# Patient Record
Sex: Female | Born: 1945 | Race: Black or African American | Hispanic: No | Marital: Married | Smoking: Former smoker
Health system: Southern US, Community
[De-identification: ages and names within clinical notes are randomized; demographics above are authoritative.]

## PROBLEM LIST (undated history)

## (undated) DIAGNOSIS — I10 Essential (primary) hypertension: Secondary | ICD-10-CM

## (undated) HISTORY — PX: BREAST CYST ASPIRATION: SHX578

## (undated) HISTORY — PX: ABDOMINAL HYSTERECTOMY: SHX81

---

## 1999-12-11 ENCOUNTER — Encounter: Admission: RE | Admit: 1999-12-11 | Discharge: 1999-12-11 | Payer: Self-pay | Admitting: Emergency Medicine

## 1999-12-11 ENCOUNTER — Encounter: Payer: Self-pay | Admitting: Emergency Medicine

## 1999-12-18 ENCOUNTER — Other Ambulatory Visit: Admission: RE | Admit: 1999-12-18 | Discharge: 1999-12-18 | Payer: Self-pay | Admitting: Emergency Medicine

## 1999-12-18 ENCOUNTER — Encounter: Payer: Self-pay | Admitting: Emergency Medicine

## 1999-12-18 ENCOUNTER — Encounter (INDEPENDENT_AMBULATORY_CARE_PROVIDER_SITE_OTHER): Payer: Self-pay | Admitting: Specialist

## 1999-12-18 ENCOUNTER — Encounter: Admission: RE | Admit: 1999-12-18 | Discharge: 1999-12-18 | Payer: Self-pay | Admitting: Emergency Medicine

## 2000-02-18 ENCOUNTER — Encounter: Admission: RE | Admit: 2000-02-18 | Discharge: 2000-02-18 | Payer: Self-pay

## 2000-08-21 ENCOUNTER — Encounter: Admission: RE | Admit: 2000-08-21 | Discharge: 2000-08-21 | Payer: Self-pay | Admitting: Emergency Medicine

## 2000-08-21 ENCOUNTER — Encounter: Payer: Self-pay | Admitting: Emergency Medicine

## 2000-12-24 ENCOUNTER — Encounter: Admission: RE | Admit: 2000-12-24 | Discharge: 2000-12-24 | Payer: Self-pay | Admitting: Emergency Medicine

## 2000-12-24 ENCOUNTER — Encounter: Payer: Self-pay | Admitting: Emergency Medicine

## 2000-12-30 ENCOUNTER — Encounter: Payer: Self-pay | Admitting: Emergency Medicine

## 2000-12-30 ENCOUNTER — Encounter: Admission: RE | Admit: 2000-12-30 | Discharge: 2000-12-30 | Payer: Self-pay | Admitting: Emergency Medicine

## 2001-06-18 ENCOUNTER — Encounter: Admission: RE | Admit: 2001-06-18 | Discharge: 2001-06-18 | Payer: Self-pay | Admitting: Emergency Medicine

## 2001-06-18 ENCOUNTER — Encounter: Payer: Self-pay | Admitting: Emergency Medicine

## 2002-01-14 ENCOUNTER — Encounter: Payer: Self-pay | Admitting: Emergency Medicine

## 2002-01-14 ENCOUNTER — Encounter: Admission: RE | Admit: 2002-01-14 | Discharge: 2002-01-14 | Payer: Self-pay | Admitting: Emergency Medicine

## 2002-06-22 ENCOUNTER — Encounter: Admission: RE | Admit: 2002-06-22 | Discharge: 2002-06-22 | Payer: Self-pay | Admitting: Emergency Medicine

## 2002-06-22 ENCOUNTER — Encounter: Payer: Self-pay | Admitting: Emergency Medicine

## 2002-07-21 ENCOUNTER — Encounter: Payer: Self-pay | Admitting: Urology

## 2002-07-21 ENCOUNTER — Encounter: Admission: RE | Admit: 2002-07-21 | Discharge: 2002-07-21 | Payer: Self-pay | Admitting: Urology

## 2002-08-02 ENCOUNTER — Ambulatory Visit (HOSPITAL_BASED_OUTPATIENT_CLINIC_OR_DEPARTMENT_OTHER): Admission: RE | Admit: 2002-08-02 | Discharge: 2002-08-02 | Payer: Self-pay | Admitting: Urology

## 2002-08-02 ENCOUNTER — Encounter: Payer: Self-pay | Admitting: Urology

## 2002-08-16 ENCOUNTER — Encounter: Payer: Self-pay | Admitting: Urology

## 2002-08-16 ENCOUNTER — Encounter: Admission: RE | Admit: 2002-08-16 | Discharge: 2002-08-16 | Payer: Self-pay | Admitting: Urology

## 2002-08-20 ENCOUNTER — Encounter: Payer: Self-pay | Admitting: Urology

## 2002-08-20 ENCOUNTER — Encounter: Admission: RE | Admit: 2002-08-20 | Discharge: 2002-08-20 | Payer: Self-pay | Admitting: Urology

## 2003-01-10 ENCOUNTER — Encounter: Payer: Self-pay | Admitting: Urology

## 2003-01-10 ENCOUNTER — Encounter: Admission: RE | Admit: 2003-01-10 | Discharge: 2003-01-10 | Payer: Self-pay | Admitting: Urology

## 2003-01-13 ENCOUNTER — Ambulatory Visit (HOSPITAL_BASED_OUTPATIENT_CLINIC_OR_DEPARTMENT_OTHER): Admission: RE | Admit: 2003-01-13 | Discharge: 2003-01-13 | Payer: Self-pay | Admitting: Urology

## 2003-01-13 ENCOUNTER — Encounter: Payer: Self-pay | Admitting: Urology

## 2003-01-19 ENCOUNTER — Encounter: Payer: Self-pay | Admitting: Emergency Medicine

## 2003-01-19 ENCOUNTER — Encounter: Admission: RE | Admit: 2003-01-19 | Discharge: 2003-01-19 | Payer: Self-pay | Admitting: Emergency Medicine

## 2003-03-30 ENCOUNTER — Ambulatory Visit (HOSPITAL_COMMUNITY): Admission: RE | Admit: 2003-03-30 | Discharge: 2003-03-30 | Payer: Self-pay | Admitting: Gastroenterology

## 2004-02-22 ENCOUNTER — Encounter: Admission: RE | Admit: 2004-02-22 | Discharge: 2004-02-22 | Payer: Self-pay | Admitting: Emergency Medicine

## 2005-03-15 ENCOUNTER — Encounter: Admission: RE | Admit: 2005-03-15 | Discharge: 2005-03-15 | Payer: Self-pay | Admitting: Emergency Medicine

## 2005-08-22 ENCOUNTER — Encounter: Admission: RE | Admit: 2005-08-22 | Discharge: 2005-08-22 | Payer: Self-pay | Admitting: Emergency Medicine

## 2005-08-25 ENCOUNTER — Encounter: Admission: RE | Admit: 2005-08-25 | Discharge: 2005-08-25 | Payer: Self-pay | Admitting: Emergency Medicine

## 2006-10-30 ENCOUNTER — Encounter: Admission: RE | Admit: 2006-10-30 | Discharge: 2006-10-30 | Payer: Self-pay | Admitting: Emergency Medicine

## 2007-11-27 ENCOUNTER — Encounter: Admission: RE | Admit: 2007-11-27 | Discharge: 2007-11-27 | Payer: Self-pay | Admitting: Emergency Medicine

## 2008-12-23 ENCOUNTER — Encounter: Admission: RE | Admit: 2008-12-23 | Discharge: 2008-12-23 | Payer: Self-pay | Admitting: Internal Medicine

## 2010-01-16 ENCOUNTER — Encounter: Admission: RE | Admit: 2010-01-16 | Discharge: 2010-01-16 | Payer: Self-pay | Admitting: Internal Medicine

## 2010-12-14 NOTE — Op Note (Signed)
NAME:  Emily Benton, Emily Benton                         ACCOUNT NO.:  [id number]   MEDICAL RECORD NO.:  [id number]                   PATIENT TYPE:  AMB   LOCATION:  ENDO                                 FACILITY:  [facility]   PHYSICIAN:  Anselmo Rod, M.D.               DATE OF BIRTH:  1946/07/03   DATE OF PROCEDURE:  03/30/2003  DATE OF DISCHARGE:                                 OPERATIVE REPORT   PROCEDURE PERFORMED:  Screening colonoscopy.   ENDOSCOPIST:  Charna Elizabeth, M.D.   INSTRUMENT USED:  Olympus video colonoscope.   INDICATIONS FOR PROCEDURE:  The patient is a 65 year old African-American  female undergoing screening colonoscopy to rule out colonic polyps, masses,  hemorrhoids, etc.   PREPROCEDURE PREPARATION:  Informed consent was procured from the patient.  The patient was fasted for eight hours prior to the procedure and prepped  with a bottle of magnesium citrate and a gallon of GoLYTELY the night prior  to the procedure.   PREPROCEDURE PHYSICAL:  The patient had stable vital signs.  Neck supple.  Chest clear to auscultation.  S1 and S2 regular.  Abdomen soft with normal  bowel sounds.   DESCRIPTION OF PROCEDURE:  The patient was placed in left lateral decubitus  position and sedated with 70 mg of Demerol and 7 mg of Versed intravenously.  Once the patient was adequately sedated and maintained on low flow oxygen  and continuous cardiac monitoring, the Olympus video colonoscope was  advanced from the rectum to the cecum and terminal ileum without difficulty.  The entire colonic mucosa appeared healthy with a normal vascular pattern,  except for two diverticula, one in the right colon and one in the left  colon.  The diverticulum in the right colon has inspissated stool in it.  No  other diverticula were identified.  Retroflexion in the rectum revealed no  abnormalities.   IMPRESSION:  Normal colonoscopy up to the terminal ileum except for a couple  of diverticula, one in  the right colon, one in the left colon.   RECOMMENDATIONS:  1. A high fiber diet with liberal fluid intake 20 to 25g in the diet has     been recommended.  2. Repeat colorectal cancer screening is recommended in the next 10 years     unless the patient develops any abnormal symptoms in the interim.  3. Outpatient follow-up on a p.r.n. basis as need arises in the future.                                                   Anselmo Rod, M.D.    JNM/MEDQ  D:  03/30/2003  T:  03/30/2003  Job:  [id number]   cc:   Reuben Likes, M.D.  [address]  [place]  [place] [address]  Fax: [phone]

## 2011-01-07 ENCOUNTER — Other Ambulatory Visit: Payer: Self-pay | Admitting: Internal Medicine

## 2011-01-07 DIAGNOSIS — Z1231 Encounter for screening mammogram for malignant neoplasm of breast: Secondary | ICD-10-CM

## 2011-01-23 ENCOUNTER — Ambulatory Visit
Admission: RE | Admit: 2011-01-23 | Discharge: 2011-01-23 | Disposition: A | Discharge status: Home / Self Care | Payer: 59 | Source: Ambulatory Visit | Attending: Internal Medicine | Admitting: Internal Medicine

## 2011-01-23 DIAGNOSIS — Z1231 Encounter for screening mammogram for malignant neoplasm of breast: Secondary | ICD-10-CM

## 2012-01-06 ENCOUNTER — Other Ambulatory Visit: Payer: Self-pay | Admitting: Internal Medicine

## 2012-01-06 DIAGNOSIS — Z1231 Encounter for screening mammogram for malignant neoplasm of breast: Secondary | ICD-10-CM

## 2012-01-29 ENCOUNTER — Ambulatory Visit
Admission: RE | Admit: 2012-01-29 | Discharge: 2012-01-29 | Disposition: A | Discharge status: Home / Self Care | Payer: PRIVATE HEALTH INSURANCE | Source: Ambulatory Visit | Attending: Internal Medicine | Admitting: Internal Medicine

## 2012-01-29 DIAGNOSIS — Z1231 Encounter for screening mammogram for malignant neoplasm of breast: Secondary | ICD-10-CM

## 2013-01-15 ENCOUNTER — Other Ambulatory Visit: Payer: Self-pay

## 2013-01-15 DIAGNOSIS — Z1231 Encounter for screening mammogram for malignant neoplasm of breast: Secondary | ICD-10-CM

## 2013-02-02 ENCOUNTER — Ambulatory Visit
Admission: RE | Admit: 2013-02-02 | Discharge: 2013-02-02 | Disposition: A | Discharge status: Home / Self Care | Payer: Medicare Other | Source: Ambulatory Visit

## 2013-02-02 DIAGNOSIS — Z1231 Encounter for screening mammogram for malignant neoplasm of breast: Secondary | ICD-10-CM

## 2013-05-15 ENCOUNTER — Emergency Department (HOSPITAL_COMMUNITY)
Admission: EM | Admit: 2013-05-15 | Discharge: 2013-05-15 | Disposition: A | Discharge status: Home / Self Care | Payer: Medicare Other | Attending: Emergency Medicine | Admitting: Emergency Medicine

## 2013-05-15 ENCOUNTER — Encounter (HOSPITAL_COMMUNITY): Payer: Self-pay | Admitting: Emergency Medicine

## 2013-05-15 DIAGNOSIS — Z87891 Personal history of nicotine dependence: Secondary | ICD-10-CM | POA: Insufficient documentation

## 2013-05-15 DIAGNOSIS — B029 Zoster without complications: Secondary | ICD-10-CM | POA: Insufficient documentation

## 2013-05-15 MED ORDER — HYDROMORPHONE HCL PF 1 MG/ML IJ SOLN
1.0000 mg | Freq: Once | INTRAMUSCULAR | Status: AC
  Administered 2013-05-15: 1 mg via INTRAMUSCULAR
  Filled 2013-05-15: qty 1

## 2013-05-15 MED ORDER — HYDROCODONE-ACETAMINOPHEN 5-325 MG PO TABS: 1.0000 | ORAL_TABLET | ORAL | Status: DC | PRN

## 2013-05-15 NOTE — ED Notes (Signed)
Pt c/o right sided lower back pain that radiates into groin and right leg; pt sts some numbness in leg; pt sts pain x 4 days; pt sts rash to side of hip that is itching

## 2013-05-15 NOTE — ED Provider Notes (Signed)
CSN: [id number]     Arrival date & time 05/15/13  1427 History   First MD Initiated Contact with Patient 05/15/13 1500     Chief Complaint  Patient presents with  . Back Pain    Patient is a 67 y.o. female presenting with back pain. The history is provided by the patient.  Back Pain Location:  Lumbar spine Radiates to: right leg. Pain severity:  Moderate Onset quality:  Gradual Duration:  4 days Timing:  Constant Progression:  Worsening Chronicity:  New Context comment:  No trauma Worsened by:  Movement Associated symptoms: numbness and tingling   Associated symptoms: no abdominal pain, no fever and no perianal numbness   She also noticed a rash with bumps about 4 days ago.  Initially it was itchy but has become more painful. The pain increases with movement and earlier in the week it was feeling numb.  The pain has also moved to her lower abdomen where the rash is. She was unable to see her doctor so she came to the ED for some pain relief.  Goody powder did not help.  History reviewed. No pertinent past medical history. History reviewed. No pertinent past surgical history. History reviewed. No pertinent family history. History  Substance Use Topics  . Smoking status: Former Games developer  . Smokeless tobacco: Not on file  . Alcohol Use: No   OB History   Grav Para Term Preterm Abortions TAB SAB Ect Mult Living                 Review of Systems  Constitutional: Negative for fever.  Gastrointestinal: Negative for abdominal pain.  Musculoskeletal: Positive for back pain.  Neurological: Positive for tingling and numbness.  All other systems reviewed and are negative.    Allergies  Review of patient's allergies indicates no known allergies.  Home Medications   Current Outpatient Rx  Name  Route  Sig  Dispense  Refill  . HYDROcodone-acetaminophen (NORCO/VICODIN) 5-325 MG per tablet   Oral   Take 1-2 tablets by mouth every 4 (four) hours as needed for pain.   30 tablet  0    BP 164/77  Pulse 97  Temp(Src) 98.6 F (37 C) (Oral)  Resp 20  Ht 4\' 11"  (1.499 m)  Wt 151 lb 7 oz (68.692 kg)  BMI 30.57 kg/m2  SpO2 96% Physical Exam  Nursing note and vitals reviewed. Constitutional: She appears well-developed and well-nourished. No distress.  HENT:  Head: Normocephalic and atraumatic.  Right Ear: External ear normal.  Left Ear: External ear normal.  Eyes: Conjunctivae are normal. Right eye exhibits no discharge. Left eye exhibits no discharge. No scleral icterus.  Neck: Neck supple. No tracheal deviation present.  Cardiovascular: Normal rate, regular rhythm and normal heart sounds.   No murmur heard. Pulmonary/Chest: Effort normal and breath sounds normal. No stridor. No respiratory distress.  Abdominal: Soft. There is no tenderness. There is no rebound.  Musculoskeletal: She exhibits no edema.  Rash as per skin exam  Neurological: She is alert. Cranial nerve deficit: no gross deficits.  Skin: Skin is warm and dry. Rash noted. Rash is papular and vesicular.  Vesicular erythematous rash in lower lumbar spine toward  Psychiatric: She has a normal mood and affect.    ED Course  Procedures (including critical care time) Labs Review Labs Reviewed - No data to display Imaging Review No results found.  EKG Interpretation   None       MDM   1. Shingles  rash    Rash is consistent with shingles.  The source of her back pain and abdominal pain.  Greater than 72 hours from onset.  Antivirals not indicated.  Will dc home on pain meds  Celene Kras, MD 05/15/13 [phone]

## 2014-01-10 ENCOUNTER — Other Ambulatory Visit: Payer: Self-pay

## 2014-01-10 DIAGNOSIS — Z1231 Encounter for screening mammogram for malignant neoplasm of breast: Secondary | ICD-10-CM

## 2014-02-03 ENCOUNTER — Ambulatory Visit
Admission: RE | Admit: 2014-02-03 | Discharge: 2014-02-03 | Disposition: A | Discharge status: Home / Self Care | Payer: Medicare Other | Source: Ambulatory Visit

## 2014-02-03 DIAGNOSIS — Z1231 Encounter for screening mammogram for malignant neoplasm of breast: Secondary | ICD-10-CM

## 2014-02-05 ENCOUNTER — Encounter (HOSPITAL_COMMUNITY): Payer: Self-pay | Admitting: Emergency Medicine

## 2014-02-05 ENCOUNTER — Emergency Department (HOSPITAL_COMMUNITY)
Admission: EM | Admit: 2014-02-05 | Discharge: 2014-02-05 | Disposition: A | Discharge status: Home / Self Care | Payer: Medicare Other | Attending: Emergency Medicine | Admitting: Emergency Medicine

## 2014-02-05 DIAGNOSIS — Z79899 Other long term (current) drug therapy: Secondary | ICD-10-CM | POA: Insufficient documentation

## 2014-02-05 DIAGNOSIS — L03119 Cellulitis of unspecified part of limb: Principal | ICD-10-CM

## 2014-02-05 DIAGNOSIS — L02419 Cutaneous abscess of limb, unspecified: Secondary | ICD-10-CM | POA: Insufficient documentation

## 2014-02-05 DIAGNOSIS — L03116 Cellulitis of left lower limb: Secondary | ICD-10-CM

## 2014-02-05 DIAGNOSIS — Z87891 Personal history of nicotine dependence: Secondary | ICD-10-CM | POA: Insufficient documentation

## 2014-02-05 MED ORDER — CLINDAMYCIN HCL 300 MG PO CAPS
300.0000 mg | ORAL_CAPSULE | Freq: Once | ORAL | Status: AC
  Administered 2014-02-05: 300 mg via ORAL
  Filled 2014-02-05: qty 1

## 2014-02-05 MED ORDER — CLINDAMYCIN HCL 300 MG PO CAPS: 300.0000 mg | ORAL_CAPSULE | Freq: Four times a day (QID) | ORAL | Status: DC

## 2014-02-05 MED ORDER — OXYCODONE-ACETAMINOPHEN 5-325 MG PO TABS: 1.0000 | ORAL_TABLET | Freq: Four times a day (QID) | ORAL | Status: DC | PRN

## 2014-02-05 NOTE — ED Notes (Signed)
Marked and dated reddened area on left upper anterior leg per PA.

## 2014-02-05 NOTE — ED Notes (Signed)
Pt requesting pain med prescription.  Unable to take codeine d/t nausea, itching and Vicodin doesn't work for her.  PA to give prescription.

## 2014-02-05 NOTE — ED Notes (Signed)
Pt states she had small bump on left upper thigh, now with redness going around site.  Pt only has pain at the site

## 2014-02-05 NOTE — ED Provider Notes (Signed)
CSN: [id number]     Arrival date & time 02/05/14  1937 History  This chart was scribed for non-physician practitioner working with Glynn OctaveStephen Rancour, MD by Elveria Risingimelie Horne, ED Scribe. This patient was seen in room E44C/E44C and the patient's care was started at 9:00 PM.   Chief Complaint  Patient presents with  . Abscess     The history is provided by the patient. No language interpreter was used.   HPI Comments: Emily Benton is a 68 y.o. female who presents to the Emergency Department complaining of suspected insect bite on her left upper thigh, which she noticed upon wakening two days ago. Patient states that initially the site was itchy and she "scratched off the top." In the following days the site has worsened, with redness presenting yesterday. Patient states that today the redness has increased. Patient rates her pain at 3/10; describing it as very mild pain. Patient reports treatment today with Benadryl cream, with no relief.   Patient denies fever, nausea, vomiting, numbness/weakness, and changes in her bladder or bowel habits. Patient denies recent tick bites/removal. Patient denies recent long trips. Patient denies history of PE/DVT.  Has not taken her prescribed estrogen in over 1 month.     History reviewed. No pertinent past medical history. History reviewed. No pertinent past surgical history. No family history on file. History  Substance Use Topics  . Smoking status: Former Games developermoker  . Smokeless tobacco: Not on file  . Alcohol Use: No   OB History   Grav Para Term Preterm Abortions TAB SAB Ect Mult Living                 Review of Systems  A complete 10 system review of systems was obtained and all systems are negative except as noted in the HPI and PMH.    Allergies  Codeine  Home Medications   Prior to Admission medications   Medication Sig Start Date End Date Taking? Authorizing Provider  buPROPion (WELLBUTRIN XL) 300 MG 24 hr tablet Take 300 mg by  mouth daily.    Historical Provider, MD  estrogens, conjugated, (PREMARIN) 0.3 MG tablet Take 0.3 mg by mouth daily. Take daily for 21 days then do not take for 7 days.    Historical Provider, MD  hydrochlorothiazide (HYDRODIURIL) 25 MG tablet Take 25 mg by mouth daily.    Historical Provider, MD  HYDROcodone-acetaminophen (NORCO/VICODIN) 5-325 MG per tablet Take 1-2 tablets by mouth every 4 (four) hours as needed for pain. 05/15/13   Linwood DibblesJon Knapp, MD  LORazepam (ATIVAN) 0.5 MG tablet Take 0.5 mg by mouth daily as needed for anxiety.    Historical Provider, MD   Triage Vitals: BP 143/67  Pulse 80  Temp(Src) 97.9 F (36.6 C) (Oral)  Resp 18  SpO2 98% Physical Exam  Nursing note and vitals reviewed. Constitutional: She appears well-developed and well-nourished. No distress.  HENT:  Head: Normocephalic and atraumatic.  Neck: Neck supple.  Cardiovascular: Normal rate, regular rhythm and intact distal pulses.   Pulmonary/Chest: Effort normal and breath sounds normal. No respiratory distress. She has no wheezes. She has no rales.  Abdominal: Soft. She exhibits no distension. There is no tenderness. There is no rebound and no guarding.  Musculoskeletal: Normal range of motion.  Neurological: She is alert.  Skin: She is not diaphoretic.  Warm, erythematous patch over the medial aspect of the left thigh. There is raised papule at superior aspect. No induration or fluctuance.   Distal pulses intact in  lower extremities.  No calf tenderness.  No edema.   Psychiatric: She has a normal mood and affect. Her behavior is normal. Thought content normal.    ED Course  Procedures (including critical care time) COORDINATION OF CARE: 9:07 PM- Will prescribe antibiotic and treat as outpatient given that the patient understands she much follow up. Discussed treatment plan with patient at bedside and patient agreed to plan.   Labs Review Labs Reviewed - No data to display  Imaging Review No results  found.   EKG Interpretation None      Briefly discussed pt with Dr Manus Gunning.   MDM   Final diagnoses:  Cellulitis of left thigh    Afebrile nontoxic healthy pt with left thigh cellulitis from what may be an insect bite.  There is an intact papule at the superior aspect of the cellulitis but clinically there is no abscess.  Line drawn around erythema by nurse.  Pt agrees to follow up with PCP or ED in 2 days for recheck and to return for worsening symptoms.  Declined pain medication.  D/C home with clindamycin.  Discussed  findings, treatment, and follow up  with patient.  Pt given return precautions.  Pt verbalizes understanding and agrees with plan.      I personally performed the services described in this documentation, which was scribed in my presence. The recorded information has been reviewed and is accurate.    Trixie Dredge, PA-C 02/05/14 2141

## 2014-02-05 NOTE — Discharge Instructions (Signed)
Read the information below.  Use the prescribed medication as directed.  Please discuss all new medications with your pharmacist.  You may return to the Emergency Department at any time for worsening condition or any new symptoms that concern you.  If you develop increased redness, swelling, pus draining from the wound, chest pain, shortness of breath, or fevers greater than 100.4, return to the ER immediately for a recheck.     Cellulitis Cellulitis is an infection of the skin and the tissue beneath it. The infected area is usually red and tender. Cellulitis occurs most often in the arms and lower legs.  CAUSES  Cellulitis is caused by bacteria that enter the skin through cracks or cuts in the skin. The most common types of bacteria that cause cellulitis are Staphylococcus and Streptococcus. SYMPTOMS   Redness and warmth.  Swelling.  Tenderness or pain.  Fever. DIAGNOSIS  Your caregiver can usually determine what is wrong based on a physical exam. Blood tests may also be done. TREATMENT  Treatment usually involves taking an antibiotic medicine. HOME CARE INSTRUCTIONS   Take your antibiotics as directed. Finish them even if you start to feel better.  Keep the infected arm or leg elevated to reduce swelling.  Apply a warm cloth to the affected area up to 4 times per day to relieve pain.  Only take over-the-counter or prescription medicines for pain, discomfort, or fever as directed by your caregiver.  Keep all follow-up appointments as directed by your caregiver. SEEK MEDICAL CARE IF:   You notice red streaks coming from the infected area.  Your red area gets larger or turns dark in color.  Your bone or joint underneath the infected area becomes painful after the skin has healed.  Your infection returns in the same area or another area.  You notice a swollen bump in the infected area.  You develop new symptoms. SEEK IMMEDIATE MEDICAL CARE IF:   You have a fever.  You  feel very sleepy.  You develop vomiting or diarrhea.  You have a general ill feeling (malaise) with muscle aches and pains. MAKE SURE YOU:   Understand these instructions.  Will watch your condition.  Will get help right away if you are not doing well or get worse. Document Released: 04/24/2005 Document Revised: 01/14/2012 Document Reviewed: 09/30/2011 [facility] Patient Information 2015 [place]. This information is not intended to replace advice given to you by your health care provider. Make sure you discuss any questions you have with your health care provider.

## 2014-02-06 NOTE — ED Provider Notes (Signed)
Medical screening examination/treatment/procedure(s) were performed by non-physician practitioner and as supervising physician I was immediately available for consultation/collaboration.   EKG Interpretation None        Tarris Delbene, MD 02/06/14 0021 

## 2014-09-09 ENCOUNTER — Other Ambulatory Visit: Payer: Self-pay | Admitting: Internal Medicine

## 2014-09-09 DIAGNOSIS — R234 Changes in skin texture: Secondary | ICD-10-CM

## 2014-09-09 DIAGNOSIS — N6489 Other specified disorders of breast: Secondary | ICD-10-CM

## 2014-09-29 ENCOUNTER — Other Ambulatory Visit: Payer: Self-pay | Admitting: Internal Medicine

## 2014-09-29 DIAGNOSIS — N6489 Other specified disorders of breast: Secondary | ICD-10-CM

## 2014-09-29 DIAGNOSIS — R234 Changes in skin texture: Secondary | ICD-10-CM

## 2014-09-29 DIAGNOSIS — N644 Mastodynia: Secondary | ICD-10-CM

## 2014-10-06 ENCOUNTER — Ambulatory Visit
Admission: RE | Admit: 2014-10-06 | Discharge: 2014-10-06 | Disposition: A | Discharge status: Home / Self Care | Payer: Medicare Other | Source: Ambulatory Visit | Attending: Internal Medicine | Admitting: Internal Medicine

## 2014-10-06 DIAGNOSIS — R234 Changes in skin texture: Secondary | ICD-10-CM

## 2014-10-06 DIAGNOSIS — N644 Mastodynia: Secondary | ICD-10-CM

## 2014-10-06 DIAGNOSIS — N6489 Other specified disorders of breast: Secondary | ICD-10-CM

## 2015-01-03 ENCOUNTER — Other Ambulatory Visit: Payer: Self-pay

## 2015-01-03 DIAGNOSIS — Z1231 Encounter for screening mammogram for malignant neoplasm of breast: Secondary | ICD-10-CM

## 2015-02-08 ENCOUNTER — Ambulatory Visit
Admission: RE | Admit: 2015-02-08 | Discharge: 2015-02-08 | Disposition: A | Discharge status: Home / Self Care | Payer: Medicare Other | Source: Ambulatory Visit

## 2015-02-08 DIAGNOSIS — Z1231 Encounter for screening mammogram for malignant neoplasm of breast: Secondary | ICD-10-CM

## 2015-06-20 ENCOUNTER — Ambulatory Visit
Admission: RE | Admit: 2015-06-20 | Discharge: 2015-06-20 | Disposition: A | Discharge status: Home / Self Care | Payer: Medicare Other | Source: Ambulatory Visit | Attending: Internal Medicine | Admitting: Internal Medicine

## 2015-06-20 ENCOUNTER — Other Ambulatory Visit: Payer: Self-pay | Admitting: Internal Medicine

## 2015-06-20 DIAGNOSIS — M25532 Pain in left wrist: Secondary | ICD-10-CM

## 2016-02-05 ENCOUNTER — Other Ambulatory Visit: Payer: Self-pay | Admitting: Internal Medicine

## 2016-02-05 DIAGNOSIS — Z1231 Encounter for screening mammogram for malignant neoplasm of breast: Secondary | ICD-10-CM

## 2016-02-15 ENCOUNTER — Ambulatory Visit
Admission: RE | Admit: 2016-02-15 | Discharge: 2016-02-15 | Disposition: A | Discharge status: Home / Self Care | Payer: Medicare Other | Source: Ambulatory Visit | Attending: Internal Medicine | Admitting: Internal Medicine

## 2016-02-15 DIAGNOSIS — Z1231 Encounter for screening mammogram for malignant neoplasm of breast: Secondary | ICD-10-CM

## 2017-01-15 ENCOUNTER — Other Ambulatory Visit: Payer: Self-pay | Admitting: Internal Medicine

## 2017-01-15 DIAGNOSIS — Z1231 Encounter for screening mammogram for malignant neoplasm of breast: Secondary | ICD-10-CM

## 2017-02-17 ENCOUNTER — Ambulatory Visit
Admission: RE | Admit: 2017-02-17 | Discharge: 2017-02-17 | Disposition: A | Discharge status: Home / Self Care | Payer: Medicare Other | Source: Ambulatory Visit | Attending: Internal Medicine | Admitting: Internal Medicine

## 2017-02-17 DIAGNOSIS — Z1231 Encounter for screening mammogram for malignant neoplasm of breast: Secondary | ICD-10-CM

## 2018-02-17 ENCOUNTER — Other Ambulatory Visit: Payer: Self-pay | Admitting: Internal Medicine

## 2018-02-17 DIAGNOSIS — Z1231 Encounter for screening mammogram for malignant neoplasm of breast: Secondary | ICD-10-CM

## 2018-03-18 ENCOUNTER — Ambulatory Visit
Admission: RE | Admit: 2018-03-18 | Discharge: 2018-03-18 | Disposition: A | Discharge status: Home / Self Care | Payer: Medicare Other | Source: Ambulatory Visit | Attending: Internal Medicine | Admitting: Internal Medicine

## 2018-03-18 DIAGNOSIS — Z1231 Encounter for screening mammogram for malignant neoplasm of breast: Secondary | ICD-10-CM

## 2018-11-01 ENCOUNTER — Encounter (HOSPITAL_COMMUNITY): Payer: Self-pay

## 2018-11-01 ENCOUNTER — Inpatient Hospital Stay (HOSPITAL_COMMUNITY)
Admission: EM | Admit: 2018-11-01 | Discharge: 2018-11-07 | DRG: 872 | Disposition: A | Discharge status: Home / Self Care | Payer: Medicare Other | Attending: Internal Medicine | Admitting: Internal Medicine

## 2018-11-01 ENCOUNTER — Emergency Department (HOSPITAL_COMMUNITY): Payer: Medicare Other

## 2018-11-01 ENCOUNTER — Other Ambulatory Visit: Payer: Self-pay

## 2018-11-01 DIAGNOSIS — B9620 Unspecified Escherichia coli [E. coli] as the cause of diseases classified elsewhere: Secondary | ICD-10-CM | POA: Diagnosis present

## 2018-11-01 DIAGNOSIS — R509 Fever, unspecified: Secondary | ICD-10-CM

## 2018-11-01 DIAGNOSIS — E872 Acidosis: Secondary | ICD-10-CM | POA: Diagnosis present

## 2018-11-01 DIAGNOSIS — Z87891 Personal history of nicotine dependence: Secondary | ICD-10-CM

## 2018-11-01 DIAGNOSIS — N12 Tubulo-interstitial nephritis, not specified as acute or chronic: Secondary | ICD-10-CM | POA: Diagnosis present

## 2018-11-01 DIAGNOSIS — N1330 Unspecified hydronephrosis: Secondary | ICD-10-CM

## 2018-11-01 DIAGNOSIS — Z833 Family history of diabetes mellitus: Secondary | ICD-10-CM

## 2018-11-01 DIAGNOSIS — R945 Abnormal results of liver function studies: Secondary | ICD-10-CM

## 2018-11-01 DIAGNOSIS — Z79899 Other long term (current) drug therapy: Secondary | ICD-10-CM

## 2018-11-01 DIAGNOSIS — A419 Sepsis, unspecified organism: Secondary | ICD-10-CM | POA: Diagnosis present

## 2018-11-01 DIAGNOSIS — R6510 Systemic inflammatory response syndrome (SIRS) of non-infectious origin without acute organ dysfunction: Secondary | ICD-10-CM | POA: Diagnosis present

## 2018-11-01 DIAGNOSIS — N179 Acute kidney failure, unspecified: Secondary | ICD-10-CM | POA: Diagnosis present

## 2018-11-01 DIAGNOSIS — E876 Hypokalemia: Secondary | ICD-10-CM | POA: Diagnosis present

## 2018-11-01 DIAGNOSIS — N136 Pyonephrosis: Secondary | ICD-10-CM | POA: Diagnosis present

## 2018-11-01 DIAGNOSIS — I10 Essential (primary) hypertension: Secondary | ICD-10-CM | POA: Diagnosis present

## 2018-11-01 DIAGNOSIS — E1165 Type 2 diabetes mellitus with hyperglycemia: Secondary | ICD-10-CM | POA: Diagnosis present

## 2018-11-01 DIAGNOSIS — E870 Hyperosmolality and hypernatremia: Secondary | ICD-10-CM | POA: Diagnosis present

## 2018-11-01 DIAGNOSIS — E119 Type 2 diabetes mellitus without complications: Secondary | ICD-10-CM

## 2018-11-01 DIAGNOSIS — R7989 Other specified abnormal findings of blood chemistry: Secondary | ICD-10-CM

## 2018-11-01 DIAGNOSIS — Z79891 Long term (current) use of opiate analgesic: Secondary | ICD-10-CM

## 2018-11-01 DIAGNOSIS — R0902 Hypoxemia: Secondary | ICD-10-CM

## 2018-11-01 DIAGNOSIS — Z7982 Long term (current) use of aspirin: Secondary | ICD-10-CM

## 2018-11-01 DIAGNOSIS — F329 Major depressive disorder, single episode, unspecified: Secondary | ICD-10-CM | POA: Diagnosis present

## 2018-11-01 HISTORY — DX: Essential (primary) hypertension: I10

## 2018-11-01 LAB — COMPREHENSIVE METABOLIC PANEL
ALT: 21 U/L (ref 0–44)
AST: 24 U/L (ref 15–41)
Albumin: 4 g/dL (ref 3.5–5.0)
Alkaline Phosphatase: 75 U/L (ref 38–126)
Anion gap: 13 (ref 5–15)
BUN: 19 mg/dL (ref 8–23)
CO2: 25 mmol/L (ref 22–32)
Calcium: 9.2 mg/dL (ref 8.9–10.3)
Chloride: 100 mmol/L (ref 98–111)
Creatinine, Ser: 1.43 mg/dL — ABNORMAL HIGH (ref 0.44–1.00)
GFR calc Af Amer: 42 mL/min — ABNORMAL LOW (ref >60)
GFR calc non Af Amer: 36 mL/min — ABNORMAL LOW (ref >60)
Glucose, Bld: 178 mg/dL — ABNORMAL HIGH (ref 70–99)
Potassium: 3.4 mmol/L — ABNORMAL LOW (ref 3.5–5.1)
Sodium: 138 mmol/L (ref 135–145)
Total Bilirubin: 0.9 mg/dL (ref 0.3–1.2)
Total Protein: 6.8 g/dL (ref 6.5–8.1)

## 2018-11-01 LAB — URINALYSIS, ROUTINE W REFLEX MICROSCOPIC
Bilirubin Urine: NEGATIVE
Glucose, UA: NEGATIVE mg/dL
Ketones, ur: 5 mg/dL — AB
Nitrite: POSITIVE — AB
Protein, ur: NEGATIVE mg/dL
Specific Gravity, Urine: 1.021 (ref 1.005–1.030)
WBC, UA: >50 WBC/hpf — ABNORMAL HIGH (ref 0–5)
pH: 5 (ref 5.0–8.0)

## 2018-11-01 LAB — CBC WITH DIFFERENTIAL/PLATELET
Abs Immature Granulocytes: 0.14 10*3/uL — ABNORMAL HIGH (ref 0.00–0.07)
Basophils Absolute: 0 10*3/uL (ref 0.0–0.1)
Basophils Relative: 0 %
Eosinophils Absolute: 0 10*3/uL (ref 0.0–0.5)
Eosinophils Relative: 0 %
HCT: 43.6 % (ref 36.0–46.0)
Hemoglobin: 14 g/dL (ref 12.0–15.0)
Immature Granulocytes: 1 %
Lymphocytes Relative: 4 %
Lymphs Abs: 0.7 10*3/uL (ref 0.7–4.0)
MCH: 26.6 pg (ref 26.0–34.0)
MCHC: 32.1 g/dL (ref 30.0–36.0)
MCV: 82.7 fL (ref 80.0–100.0)
Monocytes Absolute: 0.8 10*3/uL (ref 0.1–1.0)
Monocytes Relative: 4 %
Neutro Abs: 16.5 10*3/uL — ABNORMAL HIGH (ref 1.7–7.7)
Neutrophils Relative %: 91 %
Platelets: 303 10*3/uL (ref 150–400)
RBC: 5.27 MIL/uL — ABNORMAL HIGH (ref 3.87–5.11)
RDW: 14.6 % (ref 11.5–15.5)
WBC: 18.2 10*3/uL — ABNORMAL HIGH (ref 4.0–10.5)
nRBC: 0 % (ref 0.0–0.2)

## 2018-11-01 LAB — LIPASE, BLOOD: Lipase: 20 U/L (ref 11–51)

## 2018-11-01 MED ORDER — ONDANSETRON HCL 4 MG PO TABS
4.0000 mg | ORAL_TABLET | Freq: Four times a day (QID) | ORAL | Status: DC | PRN
  Administered 2018-11-05: 4 mg via ORAL
  Filled 2018-11-01: qty 1

## 2018-11-01 MED ORDER — SODIUM CHLORIDE 0.9 % IV BOLUS
500.0000 mL | Freq: Once | INTRAVENOUS | Status: AC
  Administered 2018-11-01: 500 mL via INTRAVENOUS

## 2018-11-01 MED ORDER — SODIUM CHLORIDE 0.9 % IV SOLN
1.0000 g | Freq: Once | INTRAVENOUS | Status: AC
  Administered 2018-11-01: 1 g via INTRAVENOUS
  Filled 2018-11-01: qty 10

## 2018-11-01 MED ORDER — SODIUM CHLORIDE 0.9 % IV SOLN
1.0000 g | INTRAVENOUS | Status: DC
  Administered 2018-11-02: 1 g via INTRAVENOUS
  Filled 2018-11-01 (×2): qty 10

## 2018-11-01 MED ORDER — SODIUM CHLORIDE 0.9 % IV SOLN
INTRAVENOUS | Status: AC
  Administered 2018-11-02 (×3): via INTRAVENOUS

## 2018-11-01 MED ORDER — ACETAMINOPHEN 650 MG RE SUPP: 650.0000 mg | Freq: Four times a day (QID) | RECTAL | Status: DC | PRN

## 2018-11-01 MED ORDER — MORPHINE SULFATE (PF) 4 MG/ML IV SOLN
4.0000 mg | Freq: Once | INTRAVENOUS | Status: AC
  Administered 2018-11-01: 4 mg via INTRAVENOUS
  Filled 2018-11-01: qty 1

## 2018-11-01 MED ORDER — MORPHINE SULFATE (PF) 2 MG/ML IV SOLN
2.0000 mg | Freq: Once | INTRAVENOUS | Status: AC
  Administered 2018-11-01: 2 mg via INTRAVENOUS
  Filled 2018-11-01: qty 1

## 2018-11-01 MED ORDER — SODIUM CHLORIDE 0.9 % IV SOLN: 1.0000 g | Freq: Once | INTRAVENOUS | Status: DC

## 2018-11-01 MED ORDER — HYDRALAZINE HCL 20 MG/ML IJ SOLN: 5.0000 mg | INTRAMUSCULAR | Status: DC | PRN

## 2018-11-01 MED ORDER — BUPROPION HCL ER (XL) 150 MG PO TB24
300.0000 mg | ORAL_TABLET | Freq: Every day | ORAL | Status: DC
  Administered 2018-11-02 – 2018-11-07 (×6): 300 mg via ORAL
  Filled 2018-11-01 (×6): qty 2

## 2018-11-01 MED ORDER — MORPHINE SULFATE (PF) 2 MG/ML IV SOLN
1.0000 mg | INTRAVENOUS | Status: DC | PRN
  Administered 2018-11-02: 1 mg via INTRAVENOUS
  Filled 2018-11-01: qty 1

## 2018-11-01 MED ORDER — LORAZEPAM 1 MG PO TABS
0.5000 mg | ORAL_TABLET | Freq: Every day | ORAL | Status: DC | PRN
  Administered 2018-11-05 – 2018-11-06 (×2): 0.5 mg via ORAL
  Filled 2018-11-01 (×3): qty 1

## 2018-11-01 MED ORDER — ONDANSETRON HCL 4 MG/2ML IJ SOLN
4.0000 mg | Freq: Four times a day (QID) | INTRAMUSCULAR | Status: DC | PRN
  Administered 2018-11-04: 4 mg via INTRAVENOUS
  Filled 2018-11-01: qty 2

## 2018-11-01 MED ORDER — ONDANSETRON HCL 4 MG/2ML IJ SOLN
4.0000 mg | Freq: Once | INTRAMUSCULAR | Status: AC
  Administered 2018-11-01: 4 mg via INTRAVENOUS
  Filled 2018-11-01: qty 2

## 2018-11-01 MED ORDER — ACETAMINOPHEN 325 MG PO TABS
650.0000 mg | ORAL_TABLET | Freq: Four times a day (QID) | ORAL | Status: DC | PRN
  Administered 2018-11-04 – 2018-11-05 (×2): 650 mg via ORAL
  Filled 2018-11-01 (×3): qty 2

## 2018-11-01 NOTE — ED Notes (Signed)
Husband Amada Kingfisher [phone]

## 2018-11-01 NOTE — ED Notes (Signed)
Nurse starting IV and will draw labs. 

## 2018-11-01 NOTE — ED Notes (Signed)
ED TO INPATIENT HANDOFF REPORT  ED Nurse Name and Phone #: Marylene Land [phone]  S Name/Age/Gender Emily Benton 73 y.o. female Room/Bed: 017C/017C  Code Status   Code Status: Full Code  Home/SNF/Other Home Patient oriented to: self, place, time and situation Is this baseline? Yes   Triage Complete: Triage complete  Chief Complaint Abd Pain/Emesis  Triage Note Pt endorses right sided flank/abdominal pain that woke her up around 0500. Pt also endorses N/V with 3 episodes of emesis. Pt denies urinary symptoms. Pt denies contact with sick persons or travel.   Allergies Allergies  Allergen Reactions  . Codeine Nausea And Vomiting    Level of Care/Admitting Diagnosis ED Disposition    ED Disposition Condition Comment   Admit  Hospital Area: [facility] [[id number]]  Level of Care: Telemetry Medical [104]  I expect the patient will be discharged within 24 hours: No (not a candidate for 5C-Observation unit)  Diagnosis: SIRS (systemic inflammatory response syndrome) [facility]) [201007]  Admitting Physician: Eduard Clos [phone]  Attending Physician: Eduard Clos Florian.Pax  PT Class (Do Not Modify): Observation [104]  PT Acc Code (Do Not Modify): Observation [10022]       B Medical/Surgery History Past Medical History:  Diagnosis Date  . Hypertension    History reviewed. No pertinent surgical history.   A IV Location/Drains/Wounds Patient Lines/Drains/Airways Status   Active Line/Drains/Airways    Name:   Placement date:   Placement time:   Site:   Days:   Peripheral IV 11/01/18 Right;Upper   11/01/18    2143    -   less than 1          Intake/Output Last 24 hours  Intake/Output Summary (Last 24 hours) at 11/01/2018 2317 Last data filed at 11/01/2018 2216 Gross per 24 hour  Intake 600 ml  Output -  Net 600 ml    Labs/Imaging Results for orders placed or performed during the hospital encounter of 11/01/18 (from the past 48 hour(s))   CBC with Differential/Platelet     Status: Abnormal   Collection Time: 11/01/18  9:01 PM  Result Value Ref Range   WBC 18.2 (H) 4.0 - 10.5 K/uL   RBC 5.27 (H) 3.87 - 5.11 MIL/uL   Hemoglobin 14.0 12.0 - 15.0 g/dL   HCT 75.8 83.2 - 54.9 %   MCV 82.7 80.0 - 100.0 fL   MCH 26.6 26.0 - 34.0 pg   MCHC 32.1 30.0 - 36.0 g/dL   RDW 82.6 41.5 - 83.0 %   Platelets 303 150 - 400 K/uL   nRBC 0.0 0.0 - 0.2 %   Neutrophils Relative % 91 %   Neutro Abs 16.5 (H) 1.7 - 7.7 K/uL   Lymphocytes Relative 4 %   Lymphs Abs 0.7 0.7 - 4.0 K/uL   Monocytes Relative 4 %   Monocytes Absolute 0.8 0.1 - 1.0 K/uL   Eosinophils Relative 0 %   Eosinophils Absolute 0.0 0.0 - 0.5 K/uL   Basophils Relative 0 %   Basophils Absolute 0.0 0.0 - 0.1 K/uL   Immature Granulocytes 1 %   Abs Immature Granulocytes 0.14 (H) 0.00 - 0.07 K/uL    Comment: Performed at [facility], [address], [place], [place] [address]  Comprehensive metabolic panel     Status: Abnormal   Collection Time: 11/01/18  9:01 PM  Result Value Ref Range   Sodium 138 135 - 145 mmol/L   Potassium 3.4 (L) 3.5 - 5.1 mmol/L  Chloride 100 98 - 111 mmol/L   CO2 25 22 - 32 mmol/L   Glucose, Bld 178 (H) 70 - 99 mg/dL   BUN 19 8 - 23 mg/dL   Creatinine, Ser 0.16 (H) 0.44 - 1.00 mg/dL   Calcium 9.2 8.9 - 55.3 mg/dL   Total Protein 6.8 6.5 - 8.1 g/dL   Albumin 4.0 3.5 - 5.0 g/dL   AST 24 15 - 41 U/L   ALT 21 0 - 44 U/L   Alkaline Phosphatase 75 38 - 126 U/L   Total Bilirubin 0.9 0.3 - 1.2 mg/dL   GFR calc non Af Amer 36 (L) >60 mL/min   GFR calc Af Amer 42 (L) >60 mL/min   Anion gap 13 5 - 15    Comment: Performed at [facility], [address], [place], [place] [address]  Lipase, blood     Status: None   Collection Time: 11/01/18  9:01 PM  Result Value Ref Range   Lipase 20 11 - 51 U/L    Comment: Performed at [facility], [address], [place], [place] [address]  Urinalysis, Routine w reflex microscopic     Status:  Abnormal   Collection Time: 11/01/18 10:16 PM  Result Value Ref Range   Color, Urine YELLOW YELLOW   APPearance HAZY (A) CLEAR   Specific Gravity, Urine 1.021 1.005 - 1.030   pH 5.0 5.0 - 8.0   Glucose, UA NEGATIVE NEGATIVE mg/dL   Hgb urine dipstick MODERATE (A) NEGATIVE   Bilirubin Urine NEGATIVE NEGATIVE   Ketones, ur 5 (A) NEGATIVE mg/dL   Protein, ur NEGATIVE NEGATIVE mg/dL   Nitrite POSITIVE (A) NEGATIVE   Leukocytes,Ua MODERATE (A) NEGATIVE   RBC / HPF 6-10 0 - 5 RBC/hpf   WBC, UA >50 (H) 0 - 5 WBC/hpf   Bacteria, UA FEW (A) NONE SEEN   Squamous Epithelial / LPF 0-5 0 - 5   Mucus PRESENT     Comment: Performed at [facility], [address] [place], [place] [address]   Ct Renal Stone Study  Result Date: 11/01/2018 CLINICAL DATA:  Right-sided flank pain beginning this morning with nausea and vomiting. EXAM: CT ABDOMEN AND PELVIS WITHOUT CONTRAST TECHNIQUE: Multidetector CT imaging of the abdomen and pelvis was performed following the standard protocol without IV contrast. COMPARISON:  None. FINDINGS: Lower chest: Mild linear atelectasis right base. Hepatobiliary: Possible subtle cholelithiasis. Liver is normal. 4 mm calcification projects over the porta hepatis which may be a common duct stone (similar finding was described on the prior report of CT 06/22/2002 as images not available). Pancreas: Normal. Spleen: Normal. Adrenals/Urinary Tract: Adrenal glands are normal. Kidneys are normal size. Nonobstructing punctate stone over the lower pole left kidney. Two small right renal stones are present with the larger measuring 3-4 mm over the lower pole. Mild right-sided hydronephrosis and mild stranding of the right perinephric fat. No definite ureteral stone identified. Bladder is normal. Stomach/Bowel: Stomach and small bowel are within normal. Appendix not visualized. Minimal diverticulosis of the colon. Vascular/Lymphatic: Very minimal calcified plaque over the abdominal aorta. No  adenopathy. Reproductive: Previous hysterectomy. Other: No free peritoneal fluid.  No free peritoneal air. Musculoskeletal: Degenerative change of the spine and hips. IMPRESSION: Mild bilateral nephrolithiasis. There is right-sided hydronephrosis. No ureteral stones visualized. Findings may be due to recent passage of a stone versus ascending infection. 4 mm calcification over the porta hepatis which may represent a ductal stone (similar finding described  on report of previous CT 2003 although no images available). Possible subtle cholelithiasis. No ductal dilatation. Minimal colonic diverticulosis. Aortic Atherosclerosis (ICD10-I70.0). Electronically Signed   By: Elberta Fortis M.D.   On: 11/01/2018 21:40    Pending Labs Unresulted Labs (From admission, onward)    Start     Ordered   11/02/18 0500  Basic metabolic panel  Tomorrow morning,   R     11/01/18 2311   11/02/18 0500  Hepatic function panel  Tomorrow morning,   R     11/01/18 2312   11/02/18 0500  CBC WITH DIFFERENTIAL  Tomorrow morning,   R     11/01/18 2312   11/01/18 2252  Urine culture  ONCE - STAT,   STAT     11/01/18 2251          Vitals/Pain Today's Vitals   11/01/18 2220 11/01/18 2223 11/01/18 2230 11/01/18 2245  BP:   (!) 143/80   Pulse: (!) 110  (!) 114   Resp: 20  (!) 22   Temp:      TempSrc:      SpO2: 99%  97%   Weight:      Height:      PainSc:  9   5     Isolation Precautions No active isolations  Medications Medications  cefTRIAXone (ROCEPHIN) 1 g in sodium chloride 0.9 % 100 mL IVPB (1 g Intravenous New Bag/Given 11/01/18 2314)  buPROPion (WELLBUTRIN XL) 24 hr tablet 300 mg (has no administration in time range)  LORazepam (ATIVAN) tablet 0.5 mg (has no administration in time range)  acetaminophen (TYLENOL) tablet 650 mg (has no administration in time range)    Or  acetaminophen (TYLENOL) suppository 650 mg (has no administration in time range)  ondansetron (ZOFRAN) tablet 4 mg (has no administration  in time range)    Or  ondansetron (ZOFRAN) injection 4 mg (has no administration in time range)  0.9 %  sodium chloride infusion (has no administration in time range)  cefTRIAXone (ROCEPHIN) 1 g in sodium chloride 0.9 % 100 mL IVPB (has no administration in time range)  cefTRIAXone (ROCEPHIN) 1 g in sodium chloride 0.9 % 100 mL IVPB (has no administration in time range)  morphine 2 MG/ML injection 1 mg (has no administration in time range)  hydrALAZINE (APRESOLINE) injection 5 mg (has no administration in time range)  morphine 2 MG/ML injection 2 mg (2 mg Intravenous Given 11/01/18 2103)  ondansetron (ZOFRAN) injection 4 mg (4 mg Intravenous Given 11/01/18 2103)  sodium chloride 0.9 % bolus 500 mL (0 mLs Intravenous Stopped 11/01/18 2215)  sodium chloride 0.9 % bolus 500 mL (500 mLs Intravenous New Bag/Given 11/01/18 2217)  morphine 4 MG/ML injection 4 mg (4 mg Intravenous Given 11/01/18 2224)    Mobility Walks with assist Low fall risk   Focused Assessments    R Recommendations: See Admitting Provider Note  Report given to:   Additional Notes: Pt alert and oriented, on room air. Pt received 1L total of NS. Pt started on rocephin in ER for pyelonephritis. Pt pain currently 5/10 after one dose of 2 mg of morphine and another dose of 4 mg of morphine. Pt ambulatory with assistance.

## 2018-11-01 NOTE — ED Notes (Signed)
ED Provider at bedside. 

## 2018-11-01 NOTE — ED Triage Notes (Signed)
Pt endorses right sided flank/abdominal pain that woke her up around 0500. Pt also endorses N/V with 3 episodes of emesis. Pt denies urinary symptoms. Pt denies contact with sick persons or travel.

## 2018-11-01 NOTE — ED Notes (Signed)
Patient transported to CT 

## 2018-11-01 NOTE — ED Provider Notes (Signed)
[facility]   CSN: [id number] Arrival date & time: 11/01/18  2029    History   Chief Complaint Chief Complaint  Patient presents with   Flank Pain    HPI Emily Benton is a 73 y.o. female.     HPI Patient presents with right flank pain that radiates around to her right abdomen which started at 5 AM this morning.  Associated with nausea and 3 episodes of vomiting.  Denies dysuria, frequency, urgency or hematuria.  No fever or chills.  No known trauma.  No new rashes.  States the pain has been constant since onset.  Worsened with movement. Past Medical History:  Diagnosis Date   Hypertension     Patient Active Problem List   Diagnosis Date Noted   Pyelonephritis 11/01/2018   SIRS (systemic inflammatory response syndrome) (HCC) 11/01/2018   ARF (acute renal failure) (HCC) 11/01/2018    History reviewed. No pertinent surgical history.   OB History   No obstetric history on file.      Home Medications    Prior to Admission medications   Medication Sig Start Date End Date Taking? Authorizing Provider  acetaminophen (TYLENOL) 500 MG tablet Take 500-1,000 mg by mouth every 6 (six) hours as needed for mild pain or headache.    Yes [provider]  Aspirin-Acetaminophen-Caffeine (GOODY HEADACHE PO) Take 1 packet by mouth as needed (for headaches or pain).   Yes [provider]  buPROPion (WELLBUTRIN XL) 300 MG 24 hr tablet Take 300 mg by mouth daily.   Yes [provider]  hydrochlorothiazide (HYDRODIURIL) 25 MG tablet Take 25 mg by mouth daily.   Yes [provider]  LORazepam (ATIVAN) 0.5 MG tablet Take 0.5 mg by mouth daily as needed for anxiety.   Yes [provider]  clindamycin (CLEOCIN) 300 MG capsule Take 1 capsule (300 mg total) by mouth 4 (four) times daily. X 7 days Patient not taking: Reported on 11/01/2018 02/05/14   Trixie DredgeWest, Emily, PA-C   oxyCODONE-acetaminophen (PERCOCET/ROXICET) 5-325 MG per tablet Take 1 tablet by mouth every 6 (six) hours as needed for moderate pain or severe pain. Patient not taking: Reported on 11/01/2018 02/05/14   Trixie DredgeWest, Emily, PA-C    Family History Family History  Problem Relation Age of Onset   Diabetes Mellitus II Mother    CAD Neg Hx     Social History Social History   Tobacco Use   Smoking status: Former Smoker   Smokeless tobacco: Never Used  Substance Use Topics   Alcohol use: No   Drug use: No     Allergies   Codeine   Review of Systems Review of Systems  Constitutional: Negative for chills and fever.  HENT: Negative for trouble swallowing.   Eyes: Negative for visual disturbance.  Respiratory: Negative for cough and shortness of breath.   Cardiovascular: Negative for chest pain.  Gastrointestinal: Positive for abdominal pain, nausea and vomiting. Negative for diarrhea.  Genitourinary: Positive for flank pain. Negative for dysuria, frequency, hematuria and urgency.  Musculoskeletal: Positive for back pain and myalgias. Negative for neck pain and neck stiffness.  Skin: Negative for rash and wound.  Neurological: Negative for dizziness, weakness, light-headedness, numbness and headaches.  All other systems reviewed and are negative.    Physical Exam Updated Vital Signs BP (!) 143/80    Pulse (!) 114    Temp 98.9 F (37.2 C) (Oral)    Resp (!) 22  Ht 4\' 11"  (1.499 m)    Wt 59 kg    SpO2 97%    BMI 26.26 kg/m   Physical Exam Vitals signs and nursing note reviewed.  Constitutional:      Appearance: Normal appearance. She is well-developed.  HENT:     Head: Normocephalic and atraumatic.  Eyes:     Pupils: Pupils are equal, round, and reactive to light.  Neck:     Musculoskeletal: Normal range of motion and neck supple.  Cardiovascular:     Rate and Rhythm: Regular rhythm. Tachycardia present.     Heart sounds: No murmur. No friction rub. No gallop.    Pulmonary:     Effort: Pulmonary effort is normal. No respiratory distress.     Breath sounds: Normal breath sounds. No stridor. No wheezing, rhonchi or rales.  Chest:     Chest wall: No tenderness.  Abdominal:     General: Bowel sounds are normal. There is no distension.     Palpations: Abdomen is soft. There is no mass.     Tenderness: There is no abdominal tenderness. There is right CVA tenderness. There is no left CVA tenderness, guarding or rebound.     Hernia: No hernia is present.     Comments: Patient with right CVA tenderness to percussion.  Musculoskeletal: Normal range of motion.        General: No swelling, tenderness, deformity or signs of injury.     Right lower leg: No edema.     Left lower leg: No edema.     Comments: No midline thoracic or lumbar tenderness.  No lower extremity swelling, asymmetry or tenderness.  Distal pulses intact.  Skin:    General: Skin is warm and dry.     Findings: No erythema or rash.     Comments: No rashes appreciated.  Neurological:     General: No focal deficit present.     Mental Status: She is alert and oriented to person, place, and time.  Psychiatric:        Behavior: Behavior normal.      ED Treatments / Results  Labs (all labs ordered are listed, but only abnormal results are displayed) Labs Reviewed  CBC WITH DIFFERENTIAL/PLATELET - Abnormal; Notable for the following components:      Result Value   WBC 18.2 (*)    RBC 5.27 (*)    Neutro Abs 16.5 (*)    Abs Immature Granulocytes 0.14 (*)    All other components within normal limits  COMPREHENSIVE METABOLIC PANEL - Abnormal; Notable for the following components:   Potassium 3.4 (*)    Glucose, Bld 178 (*)    Creatinine, Ser 1.43 (*)    GFR calc non Af Amer 36 (*)    GFR calc Af Amer 42 (*)    All other components within normal limits  URINALYSIS, ROUTINE W REFLEX MICROSCOPIC - Abnormal; Notable for the following components:   APPearance HAZY (*)    Hgb urine  dipstick MODERATE (*)    Ketones, ur 5 (*)    Nitrite POSITIVE (*)    Leukocytes,Ua MODERATE (*)    WBC, UA >50 (*)    Bacteria, UA FEW (*)    All other components within normal limits  URINE CULTURE  LIPASE, BLOOD    EKG None  Radiology Ct Renal Stone Study  Result Date: 11/01/2018 CLINICAL DATA:  Right-sided flank pain beginning this morning with nausea and vomiting. EXAM: CT ABDOMEN AND PELVIS WITHOUT CONTRAST TECHNIQUE:  Multidetector CT imaging of the abdomen and pelvis was performed following the standard protocol without IV contrast. COMPARISON:  None. FINDINGS: Lower chest: Mild linear atelectasis right base. Hepatobiliary: Possible subtle cholelithiasis. Liver is normal. 4 mm calcification projects over the porta hepatis which may be a common duct stone (similar finding was described on the prior report of CT 06/22/2002 as images not available). Pancreas: Normal. Spleen: Normal. Adrenals/Urinary Tract: Adrenal glands are normal. Kidneys are normal size. Nonobstructing punctate stone over the lower pole left kidney. Two small right renal stones are present with the larger measuring 3-4 mm over the lower pole. Mild right-sided hydronephrosis and mild stranding of the right perinephric fat. No definite ureteral stone identified. Bladder is normal. Stomach/Bowel: Stomach and small bowel are within normal. Appendix not visualized. Minimal diverticulosis of the colon. Vascular/Lymphatic: Very minimal calcified plaque over the abdominal aorta. No adenopathy. Reproductive: Previous hysterectomy. Other: No free peritoneal fluid.  No free peritoneal air. Musculoskeletal: Degenerative change of the spine and hips. IMPRESSION: Mild bilateral nephrolithiasis. There is right-sided hydronephrosis. No ureteral stones visualized. Findings may be due to recent passage of a stone versus ascending infection. 4 mm calcification over the porta hepatis which may represent a ductal stone (similar finding described  on report of previous CT 2003 although no images available). Possible subtle cholelithiasis. No ductal dilatation. Minimal colonic diverticulosis. Aortic Atherosclerosis (ICD10-I70.0). Electronically Signed   By: Elberta Fortis M.D.   On: 11/01/2018 21:40    Procedures Procedures (including critical care time)  Medications Ordered in ED Medications  cefTRIAXone (ROCEPHIN) 1 g in sodium chloride 0.9 % 100 mL IVPB (has no administration in time range)  morphine 2 MG/ML injection 2 mg (2 mg Intravenous Given 11/01/18 2103)  ondansetron (ZOFRAN) injection 4 mg (4 mg Intravenous Given 11/01/18 2103)  sodium chloride 0.9 % bolus 500 mL (0 mLs Intravenous Stopped 11/01/18 2215)  sodium chloride 0.9 % bolus 500 mL (500 mLs Intravenous New Bag/Given 11/01/18 2217)  morphine 4 MG/ML injection 4 mg (4 mg Intravenous Given 11/01/18 2224)     Initial Impression / Assessment and Plan / ED Course  I have reviewed the triage vital signs and the nursing notes.  Pertinent labs & imaging results that were available during my care of the patient were reviewed by me and considered in my medical decision making (see chart for details).        CT with stranding around the right kidney consistent with recently passed kidney stone or sending infection.  Patient has evidence of UTI on UA.  Elevated white blood cell count.  Urine sent for culture.  Started on Rocephin.  Also given IV fluids.  Suspect mild AKI.  Patient is feeling better after medication.  Continues to be mildly tachycardic.  Patient's husband was updated.  Discussed with hospitalist and will admit.  Final Clinical Impressions(s) / ED Diagnoses   Final diagnoses:  Pyelonephritis of right kidney    ED Discharge Orders    None       Loren Racer, MD 11/01/18 2310

## 2018-11-02 ENCOUNTER — Observation Stay (HOSPITAL_COMMUNITY): Payer: Medicare Other

## 2018-11-02 ENCOUNTER — Encounter (HOSPITAL_COMMUNITY): Payer: Self-pay | Admitting: *Deleted

## 2018-11-02 ENCOUNTER — Other Ambulatory Visit: Payer: Self-pay

## 2018-11-02 DIAGNOSIS — N12 Tubulo-interstitial nephritis, not specified as acute or chronic: Secondary | ICD-10-CM

## 2018-11-02 DIAGNOSIS — R6520 Severe sepsis without septic shock: Secondary | ICD-10-CM

## 2018-11-02 DIAGNOSIS — E119 Type 2 diabetes mellitus without complications: Secondary | ICD-10-CM | POA: Diagnosis not present

## 2018-11-02 DIAGNOSIS — Z833 Family history of diabetes mellitus: Secondary | ICD-10-CM | POA: Diagnosis not present

## 2018-11-02 DIAGNOSIS — E1165 Type 2 diabetes mellitus with hyperglycemia: Secondary | ICD-10-CM | POA: Diagnosis present

## 2018-11-02 DIAGNOSIS — N179 Acute kidney failure, unspecified: Secondary | ICD-10-CM

## 2018-11-02 DIAGNOSIS — F329 Major depressive disorder, single episode, unspecified: Secondary | ICD-10-CM | POA: Diagnosis present

## 2018-11-02 DIAGNOSIS — Z87891 Personal history of nicotine dependence: Secondary | ICD-10-CM | POA: Diagnosis not present

## 2018-11-02 DIAGNOSIS — Z79891 Long term (current) use of opiate analgesic: Secondary | ICD-10-CM | POA: Diagnosis not present

## 2018-11-02 DIAGNOSIS — R6510 Systemic inflammatory response syndrome (SIRS) of non-infectious origin without acute organ dysfunction: Secondary | ICD-10-CM | POA: Diagnosis not present

## 2018-11-02 DIAGNOSIS — E872 Acidosis: Secondary | ICD-10-CM | POA: Diagnosis present

## 2018-11-02 DIAGNOSIS — Z7982 Long term (current) use of aspirin: Secondary | ICD-10-CM | POA: Diagnosis not present

## 2018-11-02 DIAGNOSIS — B9620 Unspecified Escherichia coli [E. coli] as the cause of diseases classified elsewhere: Secondary | ICD-10-CM | POA: Diagnosis present

## 2018-11-02 DIAGNOSIS — Z79899 Other long term (current) drug therapy: Secondary | ICD-10-CM | POA: Diagnosis not present

## 2018-11-02 DIAGNOSIS — E870 Hyperosmolality and hypernatremia: Secondary | ICD-10-CM | POA: Diagnosis present

## 2018-11-02 DIAGNOSIS — E876 Hypokalemia: Secondary | ICD-10-CM | POA: Diagnosis present

## 2018-11-02 DIAGNOSIS — A4151 Sepsis due to Escherichia coli [E. coli]: Secondary | ICD-10-CM | POA: Diagnosis not present

## 2018-11-02 DIAGNOSIS — N136 Pyonephrosis: Secondary | ICD-10-CM | POA: Diagnosis present

## 2018-11-02 DIAGNOSIS — A419 Sepsis, unspecified organism: Secondary | ICD-10-CM | POA: Diagnosis present

## 2018-11-02 DIAGNOSIS — I10 Essential (primary) hypertension: Secondary | ICD-10-CM | POA: Diagnosis present

## 2018-11-02 LAB — CBC WITH DIFFERENTIAL/PLATELET
Abs Immature Granulocytes: 0.17 10*3/uL — ABNORMAL HIGH (ref 0.00–0.07)
Basophils Absolute: 0 10*3/uL (ref 0.0–0.1)
Basophils Relative: 0 %
Eosinophils Absolute: 0.3 10*3/uL (ref 0.0–0.5)
Eosinophils Relative: 2 %
HCT: 37 % (ref 36.0–46.0)
Hemoglobin: 11.9 g/dL — ABNORMAL LOW (ref 12.0–15.0)
Immature Granulocytes: 1 %
Lymphocytes Relative: 4 %
Lymphs Abs: 0.6 10*3/uL — ABNORMAL LOW (ref 0.7–4.0)
MCH: 26.7 pg (ref 26.0–34.0)
MCHC: 32.2 g/dL (ref 30.0–36.0)
MCV: 83 fL (ref 80.0–100.0)
Monocytes Absolute: 1 10*3/uL (ref 0.1–1.0)
Monocytes Relative: 6 %
Neutro Abs: 15.1 10*3/uL — ABNORMAL HIGH (ref 1.7–7.7)
Neutrophils Relative %: 87 %
Platelets: 249 10*3/uL (ref 150–400)
RBC: 4.46 MIL/uL (ref 3.87–5.11)
RDW: 15 % (ref 11.5–15.5)
WBC: 17.2 10*3/uL — ABNORMAL HIGH (ref 4.0–10.5)
nRBC: 0 % (ref 0.0–0.2)

## 2018-11-02 LAB — CBC
HCT: 35.7 % — ABNORMAL LOW (ref 36.0–46.0)
Hemoglobin: 11.2 g/dL — ABNORMAL LOW (ref 12.0–15.0)
MCH: 25.9 pg — ABNORMAL LOW (ref 26.0–34.0)
MCHC: 31.4 g/dL (ref 30.0–36.0)
MCV: 82.6 fL (ref 80.0–100.0)
Platelets: 227 10*3/uL (ref 150–400)
RBC: 4.32 MIL/uL (ref 3.87–5.11)
RDW: 15 % (ref 11.5–15.5)
WBC: 9 10*3/uL (ref 4.0–10.5)
nRBC: 0 % (ref 0.0–0.2)

## 2018-11-02 LAB — HEPATIC FUNCTION PANEL
ALT: 17 U/L (ref 0–44)
AST: 20 U/L (ref 15–41)
Albumin: 3 g/dL — ABNORMAL LOW (ref 3.5–5.0)
Alkaline Phosphatase: 58 U/L (ref 38–126)
Bilirubin, Direct: 0.2 mg/dL (ref 0.0–0.2)
Indirect Bilirubin: 0.4 mg/dL (ref 0.3–0.9)
Total Bilirubin: 0.6 mg/dL (ref 0.3–1.2)
Total Protein: 5.3 g/dL — ABNORMAL LOW (ref 6.5–8.1)

## 2018-11-02 LAB — LACTIC ACID, PLASMA
Lactic Acid, Venous: 1.4 mmol/L (ref 0.5–1.9)
Lactic Acid, Venous: 2.5 mmol/L (ref 0.5–1.9)
Lactic Acid, Venous: 1.9 mmol/L (ref 0.5–1.9)
Lactic Acid, Venous: 1.6 mmol/L (ref 0.5–1.9)
Lactic Acid, Venous: 2.3 mmol/L (ref 0.5–1.9)
Lactic Acid, Venous: 1.7 mmol/L (ref 0.5–1.9)

## 2018-11-02 LAB — BASIC METABOLIC PANEL
Anion gap: 12 (ref 5–15)
BUN: 15 mg/dL (ref 8–23)
CO2: 21 mmol/L — ABNORMAL LOW (ref 22–32)
Calcium: 8.4 mg/dL — ABNORMAL LOW (ref 8.9–10.3)
Chloride: 106 mmol/L (ref 98–111)
Creatinine, Ser: 1.29 mg/dL — ABNORMAL HIGH (ref 0.44–1.00)
GFR calc Af Amer: 48 mL/min — ABNORMAL LOW (ref >60)
GFR calc non Af Amer: 41 mL/min — ABNORMAL LOW (ref >60)
Glucose, Bld: 138 mg/dL — ABNORMAL HIGH (ref 70–99)
Potassium: 3 mmol/L — ABNORMAL LOW (ref 3.5–5.1)
Sodium: 139 mmol/L (ref 135–145)

## 2018-11-02 LAB — HEMOGLOBIN A1C
Hgb A1c MFr Bld: 6.6 % — ABNORMAL HIGH (ref 4.8–5.6)
Mean Plasma Glucose: 142.72 mg/dL

## 2018-11-02 LAB — TSH: TSH: 0.729 u[IU]/mL (ref 0.350–4.500)

## 2018-11-02 LAB — GLUCOSE, CAPILLARY
Glucose-Capillary: 151 mg/dL — ABNORMAL HIGH (ref 70–99)
Glucose-Capillary: 127 mg/dL — ABNORMAL HIGH (ref 70–99)

## 2018-11-02 LAB — PROCALCITONIN: Procalcitonin: 7.82 ng/mL

## 2018-11-02 MED ORDER — METOPROLOL TARTRATE 5 MG/5ML IV SOLN
2.5000 mg | Freq: Once | INTRAVENOUS | Status: AC
  Administered 2018-11-02: 2.5 mg via INTRAVENOUS
  Filled 2018-11-02: qty 5

## 2018-11-02 MED ORDER — SODIUM CHLORIDE 0.9 % IV BOLUS (SEPSIS)
250.0000 mL | Freq: Once | INTRAVENOUS | Status: AC
  Administered 2018-11-02: 250 mL via INTRAVENOUS

## 2018-11-02 MED ORDER — SODIUM CHLORIDE 0.9 % IV BOLUS (SEPSIS)
1000.0000 mL | Freq: Once | INTRAVENOUS | Status: AC
  Administered 2018-11-02: 1000 mL via INTRAVENOUS

## 2018-11-02 MED ORDER — SODIUM CHLORIDE 0.9 % IV BOLUS
500.0000 mL | Freq: Once | INTRAVENOUS | Status: AC
  Administered 2018-11-02: 500 mL via INTRAVENOUS

## 2018-11-02 MED ORDER — INSULIN ASPART 100 UNIT/ML ~~LOC~~ SOLN
0.0000 [IU] | Freq: Three times a day (TID) | SUBCUTANEOUS | Status: DC
  Administered 2018-11-02: 2 [IU] via SUBCUTANEOUS
  Administered 2018-11-03 (×3): 1 [IU] via SUBCUTANEOUS
  Administered 2018-11-04 (×2): 2 [IU] via SUBCUTANEOUS
  Administered 2018-11-05: 1 [IU] via SUBCUTANEOUS

## 2018-11-02 MED ORDER — MORPHINE SULFATE (PF) 2 MG/ML IV SOLN
2.0000 mg | INTRAVENOUS | Status: DC | PRN
  Administered 2018-11-02 – 2018-11-04 (×9): 2 mg via INTRAVENOUS
  Filled 2018-11-02 (×9): qty 1

## 2018-11-02 NOTE — Progress Notes (Signed)
HR sustaining in the 130's. Patient c/o pain. Morphine 1mg  IV given. Dr Toniann Fail up to see patient. EKG and 500cc NS bolus ordered.

## 2018-11-02 NOTE — Progress Notes (Signed)
Inpatient Diabetes Program Recommendations  AACE/ADA: New Consensus Statement on Inpatient Glycemic Control (2015)  Target Ranges:  Prepandial:   less than 140 mg/dL      Peak postprandial:   less than 180 mg/dL (1-2 hours)      Critically ill patients:  140 - 180 mg/dL   Lab Results  Component Value Date   HGBA1C 6.6 (H) 11/02/2018    Review of Glycemic Control Results for Emily Benton, Emily Benton (MRN [id number]) as of 11/02/2018 13:44  Ref. Range 11/01/2018 21:01 11/02/2018 05:04  Glucose Latest Ref Range: 70 - 99 mg/dL 588 (H) 502 (H)   Diabetes history: New onset? Outpatient Diabetes medications: none Current orders for Inpatient glycemic control: none  Inpatient Diabetes Program Recommendations:    Noted A1C 6.6%, indicative of diabetes diagnosis per ADA.  Once diagnosed by Md progress note, will order LWWDM and reach out to patient. Given glucose on admission, may want to consider Novolog 0-9 units TID & HS.   Thanks, Lujean Rave, MSN, RNC-OB Diabetes Coordinator [phone] (8a-5p)

## 2018-11-02 NOTE — H&P (Signed)
History and Physical    Emily Benton MHD:[id number] DOB: 1946-02-18 DOA: 11/01/2018  PCP: Renford Dills, MD  Patient coming from: Home.  Chief Complaint: Right flank pain.  HPI: Emily Benton is a 73 y.o. female with history of hypertension, depression presents to the ER because of pain in the right flank for the last 2 days with nausea vomiting denies any therapy denies any recent sick contacts or travel.  Denies any shortness of breath chest pain productive cough and no subjective feeling of fever chills or myalgias.  Denies dysuria.  ED Course: In the ER patient was mildly febrile with temperature around 99 degree tachycardia heart rate around 130 sinus tachycardia.  Lab work show WBC count of 18,000 potassium of 3.4 creatinine 1.4 glucose 178 AST 24 ALT 21 lipase 20.  UA shows positive nitrites few bacteria large amount of WBC.  CT renal study was done which shows features concerning for right-sided hydronephrosis with possibly passed stone or ascending infection.  Patient was given fluid bolus ceftriaxone and admitted for SIRS likely from pyelonephritis.  In addition CAT scan also shows possibility of ductal stone.  Patient's pain is mostly in the right flank area.  Review of Systems: As per HPI, rest all negative.   Past Medical History:  Diagnosis Date   Hypertension     History reviewed. No pertinent surgical history.   reports that she has quit smoking. She has never used smokeless tobacco. She reports that she does not drink alcohol or use drugs.  Allergies  Allergen Reactions   Codeine Nausea And Vomiting    Family History  Problem Relation Age of Onset   Diabetes Mellitus II Mother    CAD Neg Hx     Prior to Admission medications   Medication Sig Start Date End Date Taking? Authorizing Provider  acetaminophen (TYLENOL) 500 MG tablet Take 500-1,000 mg by mouth every 6 (six) hours as needed for mild pain or headache.    Yes [provider]  Aspirin-Acetaminophen-Caffeine (GOODY HEADACHE PO) Take 1 packet by mouth as needed (for headaches or pain).   Yes [provider]  buPROPion (WELLBUTRIN XL) 300 MG 24 hr tablet Take 300 mg by mouth daily.   Yes [provider]  hydrochlorothiazide (HYDRODIURIL) 25 MG tablet Take 25 mg by mouth daily.   Yes [provider]  LORazepam (ATIVAN) 0.5 MG tablet Take 0.5 mg by mouth daily as needed for anxiety.   Yes [provider]  clindamycin (CLEOCIN) 300 MG capsule Take 1 capsule (300 mg total) by mouth 4 (four) times daily. X 7 days Patient not taking: Reported on 11/01/2018 02/05/14   Trixie Dredge, PA-C  oxyCODONE-acetaminophen (PERCOCET/ROXICET) 5-325 MG per tablet Take 1 tablet by mouth every 6 (six) hours as needed for moderate pain or severe pain. Patient not taking: Reported on 11/01/2018 02/05/14   Trixie Dredge, [place]    Physical Exam: Vitals:   11/01/18 2230 11/01/18 2300 11/01/18 2315 11/01/18 2330  BP: (!) 143/80 136/78 (!) 142/81 139/76  Pulse: (!) 114 (!) 115 (!) 115 (!) 114  Resp: (!) 22 20 (!) 23 (!) 23  Temp:      TempSrc:      SpO2: 97% 96% 98% 96%  Weight:      Height:          Constitutional: Moderately built and nourished. Vitals:   11/01/18 2230 11/01/18 2300 11/01/18 2315 11/01/18 2330  BP: (!) 143/80 136/78 (!) 142/81 139/76  Pulse: Marland Kitchen)  114 (!) 115 (!) 115 (!) 114  Resp: (!) 22 20 (!) 23 (!) 23  Temp:      TempSrc:      SpO2: 97% 96% 98% 96%  Weight:      Height:       Eyes: Anicteric no pallor. ENMT: No discharge from the ears eyes nose and mouth. Neck: No mass felt.  No neck rigidity. Respiratory: No rhonchi or crepitations. Cardiovascular: S1-S2 heard. Abdomen: Soft nontender bowel sounds present. Musculoskeletal: No edema.  No joint effusion. Skin: No rash. Neurologic: Alert awake oriented to time place and person.  Moves all extremities. Psychiatric: Appears normal per normal affect.   Labs on Admission: I  have personally reviewed following labs and imaging studies  CBC: Recent Labs  Lab 11/01/18 2101  WBC 18.2*  NEUTROABS 16.5*  HGB 14.0  HCT 43.6  MCV 82.7  PLT 303   Basic Metabolic Panel: Recent Labs  Lab 11/01/18 2101  NA 138  K 3.4*  CL 100  CO2 25  GLUCOSE 178*  BUN 19  CREATININE 1.43*  CALCIUM 9.2   GFR: Estimated Creatinine Clearance: 27.8 mL/min (A) (by C-G formula based on SCr of 1.43 mg/dL (H)). Liver Function Tests: Recent Labs  Lab 11/01/18 2101  AST 24  ALT 21  ALKPHOS 75  BILITOT 0.9  PROT 6.8  ALBUMIN 4.0   Recent Labs  Lab 11/01/18 2101  LIPASE 20   No results for input(s): AMMONIA in the last 168 hours. Coagulation Profile: No results for input(s): INR, PROTIME in the last 168 hours. Cardiac Enzymes: No results for input(s): CKTOTAL, CKMB, CKMBINDEX, TROPONINI in the last 168 hours. BNP (last 3 results) No results for input(s): PROBNP in the last 8760 hours. HbA1C: No results for input(s): HGBA1C in the last 72 hours. CBG: No results for input(s): GLUCAP in the last 168 hours. Lipid Profile: No results for input(s): CHOL, HDL, LDLCALC, TRIG, CHOLHDL, LDLDIRECT in the last 72 hours. Thyroid Function Tests: No results for input(s): TSH, T4TOTAL, FREET4, T3FREE, THYROIDAB in the last 72 hours. Anemia Panel: No results for input(s): VITAMINB12, FOLATE, FERRITIN, TIBC, IRON, RETICCTPCT in the last 72 hours. Urine analysis:    Component Value Date/Time   COLORURINE YELLOW 11/01/2018 2216   APPEARANCEUR HAZY (A) 11/01/2018 2216   LABSPEC 1.021 11/01/2018 2216   PHURINE 5.0 11/01/2018 2216   GLUCOSEU NEGATIVE 11/01/2018 2216   HGBUR MODERATE (A) 11/01/2018 2216   BILIRUBINUR NEGATIVE 11/01/2018 2216   KETONESUR 5 (A) 11/01/2018 2216   PROTEINUR NEGATIVE 11/01/2018 2216   NITRITE POSITIVE (A) 11/01/2018 2216   LEUKOCYTESUR MODERATE (A) 11/01/2018 2216   Sepsis Labs: @LABRCNTIP (procalcitonin:4,lacticidven:4) )No results found for  this or any previous visit (from the past 240 hour(s)).   Radiological Exams on Admission: Ct Renal Stone Study  Result Date: 11/01/2018 CLINICAL DATA:  Right-sided flank pain beginning this morning with nausea and vomiting. EXAM: CT ABDOMEN AND PELVIS WITHOUT CONTRAST TECHNIQUE: Multidetector CT imaging of the abdomen and pelvis was performed following the standard protocol without IV contrast. COMPARISON:  None. FINDINGS: Lower chest: Mild linear atelectasis right base. Hepatobiliary: Possible subtle cholelithiasis. Liver is normal. 4 mm calcification projects over the porta hepatis which may be a common duct stone (similar finding was described on the prior report of CT 06/22/2002 as images not available). Pancreas: Normal. Spleen: Normal. Adrenals/Urinary Tract: Adrenal glands are normal. Kidneys are normal size. Nonobstructing punctate stone over the lower pole left kidney. Two small right renal stones  are present with the larger measuring 3-4 mm over the lower pole. Mild right-sided hydronephrosis and mild stranding of the right perinephric fat. No definite ureteral stone identified. Bladder is normal. Stomach/Bowel: Stomach and small bowel are within normal. Appendix not visualized. Minimal diverticulosis of the colon. Vascular/Lymphatic: Very minimal calcified plaque over the abdominal aorta. No adenopathy. Reproductive: Previous hysterectomy. Other: No free peritoneal fluid.  No free peritoneal air. Musculoskeletal: Degenerative change of the spine and hips. IMPRESSION: Mild bilateral nephrolithiasis. There is right-sided hydronephrosis. No ureteral stones visualized. Findings may be due to recent passage of a stone versus ascending infection. 4 mm calcification over the porta hepatis which may represent a ductal stone (similar finding described on report of previous CT 2003 although no images available). Possible subtle cholelithiasis. No ductal dilatation. Minimal colonic diverticulosis. Aortic  Atherosclerosis (ICD10-I70.0). Electronically Signed   By: Elberta Fortis M.D.   On: 11/01/2018 21:40     Assessment/Plan Principal Problem:   SIRS (systemic inflammatory response syndrome) (HCC) Active Problems:   Pyelonephritis of right kidney   ARF (acute renal failure) (HCC)    1. SIRS likely from pyelonephritis -discussed with on-call urologist Dr. Kevan Rosebush who at this time advised to treat worsening infection and patient possibly could have also passed a stone.  No acute intervention required at this time for the hydronephrosis.  Follow cultures.  Continue hydration follow lactic acid levels and procalcitonin levels. 2. Acute renal failure likely from vomiting no old labs to compare holding hydrochlorothiazide continue with hydration follow metabolic panel. 3. Possible ductal stone seen in the CAT scan for which I have ordered MRCP.  Follow LFTs.  Pain is mostly in the right flank area. 4. Tachycardia likely from pain and possible infection.  EKG is pending.  Continue to monitor in telemetry. 5. Hyperglycemia check hemoglobin A1c. 6. History of hypertension presently hydrochlorothiazide on hold due to possible developing sepsis and dehydration for which I have placed patient on PRN IV hydralazine. 7. History of depression we will continue home medications.   DVT prophylaxis: SCDs in anticipation of possible procedure. Code Status: Full code. Family Communication: No family at the bedside. Disposition Plan: Home. Consults called: Discussed with urologist. Admission status: Observation.   Eduard Clos MD [facility] Pager [phone].  If 7PM-7AM, please contact night-coverage [email or web address] Password [email or web address]  11/02/2018, 12:03 AM

## 2018-11-02 NOTE — Progress Notes (Signed)
CRITICAL VALUE ALERT  Critical Value:  2.3  Date & Time Notied:  1923  Provider Notified: Craige Cotta, NP  Orders Received/Actions taken: Awaiting for orders.

## 2018-11-02 NOTE — Progress Notes (Addendum)
PROGRESS NOTE  Santiago Linen EXH:[id number] DOB: [birth year] DOA: 11/01/2018 PCP: Renford Dills, MD  HPI/Recap of past 87 hours: 73 year old female with past medical history of hypertension and depression presented to the emergency room on the night of 4/5 with complaints of right flank pain plus nausea and vomiting x2 days.  Patient found to have a large UTI and also signs of Sirs with tachycardia and mild fever.  Started on IV fluids and Rocephin.  CT scan noted right-sided hydronephrosis with possible passed stone or ascending infection.  Is also question of ductal stone which was ruled out by MRCP.  Patient initially brought in for observation  Following arrival to floor, she started to become more tachycardic and blood pressure dropped in the 140s down to the 100s despite continuous IV fluids.  Lactic acid level following arrival to floor elevated at 2.5.  Concerns for early sepsis.  Patient given fluid bolus and follow-up lactic acid level several hours later down to 1.9.  Patient herself continues to feel rough and weak with complaints of right flank pain with some pain radiating to the anterior.  Assessment/Plan: Principal Problem: Sepsis secondary to pyelonephritis of right kidney: Noted early hydronephrosis, but no evidence of stone obstruction.  Would favor continuing IV of antibiotics and IV fluids.  Patient meets criteria for sepsis at admission given her fever, leukocytosis, lactic acidosis and borderline hypotension.  Lactic acid level started to slowly come down.  We will continue to follow very closely.  Monitor blood cultures. Active Problems:  Diabetes mellitus, type II, new onset: Noted of some hyperglycemia.  A1c checked and found to be elevated at 6.6.  No previous history of.  Discussed with diabetes coordinator.  Will be starting on sliding scale.    ARF (acute renal failure) (HCC): Secondary to early sepsis.   Code Status: Full code  Family Communication:  Left message for husband  Disposition Plan: Given now presenting with signs of sepsis, will change to inpatient.  She will need to stay here until her sepsis has stabilized, cultures are back   Consultants:  Urology-discussed by phone with Dr. Fayrene Fearing  Procedures:  None  Antimicrobials:  IV Rocephin 4/5-present  DVT prophylaxis: SCDs   Objective: Vitals:   11/02/18 0445 11/02/18 0915  BP: 105/67 115/72  Pulse: (!) 128 (!) 122  Resp: 18 18  Temp: 99.1 F (37.3 C) 99 F (37.2 C)  SpO2: 98% 100%    Intake/Output Summary (Last 24 hours) at 11/02/2018 1328 Last data filed at 11/02/2018 0600 Gross per 24 hour  Intake 1825 ml  Output 0 ml  Net 1825 ml   Filed Weights   11/01/18 2040 11/02/18 0012 11/02/18 0117  Weight: 59 kg 66 kg 66 kg   Body mass index is 29.39 kg/m.  Exam:   General: Alert and oriented x3, no acute distress  HEENT: Normocephalic and atraumatic, mucous memories are slightly dry  Neck: Supple, no JVD  Cardiovascular: Regular rhythm, tachycardic  Respiratory: Clear to auscultation bilaterally  Abdomen: Soft, nontender, nondistended, positive bowel sounds  Back: Right flank tenderness  Musculoskeletal: No clubbing or cyanosis or edema  Skin: No skin breaks, tears or lesions  Neuro: No focal deficits  Psychiatry: Appropriate, no evidence of psychoses   Data Reviewed: CBC: Recent Labs  Lab 11/01/18 2101 11/02/18 0504  WBC 18.2* 17.2*  NEUTROABS 16.5* 15.1*  HGB 14.0 11.9*  HCT 43.6 37.0  MCV 82.7 83.0  PLT 303 249   Basic Metabolic Panel: Recent Labs  Lab 11/01/18 2101 11/02/18 0504  NA 138 139  K 3.4* 3.0*  CL 100 106  CO2 25 21*  GLUCOSE 178* 138*  BUN 19 15  CREATININE 1.43* 1.29*  CALCIUM 9.2 8.4*   GFR: Estimated Creatinine Clearance: 32.5 mL/min (A) (by C-G formula based on SCr of 1.29 mg/dL (H)). Liver Function Tests: Recent Labs  Lab 11/01/18 2101 11/02/18 0504  AST 24 20  ALT 21 17  ALKPHOS 75 58   BILITOT 0.9 0.6  PROT 6.8 5.3*  ALBUMIN 4.0 3.0*   Recent Labs  Lab 11/01/18 2101  LIPASE 20   No results for input(s): AMMONIA in the last 168 hours. Coagulation Profile: No results for input(s): INR, PROTIME in the last 168 hours. Cardiac Enzymes: No results for input(s): CKTOTAL, CKMB, CKMBINDEX, TROPONINI in the last 168 hours. BNP (last 3 results) No results for input(s): PROBNP in the last 8760 hours. HbA1C: Recent Labs    11/02/18 0504  HGBA1C 6.6*   CBG: No results for input(s): GLUCAP in the last 168 hours. Lipid Profile: No results for input(s): CHOL, HDL, LDLCALC, TRIG, CHOLHDL, LDLDIRECT in the last 72 hours. Thyroid Function Tests: Recent Labs    11/02/18 0554  TSH 0.729   Anemia Panel: No results for input(s): VITAMINB12, FOLATE, FERRITIN, TIBC, IRON, RETICCTPCT in the last 72 hours. Urine analysis:    Component Value Date/Time   COLORURINE YELLOW 11/01/2018 2216   APPEARANCEUR HAZY (A) 11/01/2018 2216   LABSPEC 1.021 11/01/2018 2216   PHURINE 5.0 11/01/2018 2216   GLUCOSEU NEGATIVE 11/01/2018 2216   HGBUR MODERATE (A) 11/01/2018 2216   BILIRUBINUR NEGATIVE 11/01/2018 2216   KETONESUR 5 (A) 11/01/2018 2216   PROTEINUR NEGATIVE 11/01/2018 2216   NITRITE POSITIVE (A) 11/01/2018 2216   LEUKOCYTESUR MODERATE (A) 11/01/2018 2216   Sepsis Labs: @LABRCNTIP (procalcitonin:4,lacticidven:4)  )No results found for this or any previous visit (from the past 240 hour(s)).    Studies: Mr Abdomen Mrcp Wo Contrast  Result Date: 11/02/2018 CLINICAL DATA:  Possible cholelithiasis and choledocholithiasis on recent CT. EXAM: MRI ABDOMEN WITHOUT CONTRAST  (INCLUDING MRCP) TECHNIQUE: Multiplanar multisequence MR imaging of the abdomen was performed. Heavily T2-weighted images of the biliary and pancreatic ducts were obtained, and three-dimensional MRCP images were rendered by post processing. COMPARISON:  CT 11/01/2018 FINDINGS: Lower chest: Basilar atelectasis  bilaterally. Hepatobiliary: No focal abnormality in the liver on this study without intravenous contrast. There is no evidence for gallstones, gallbladder wall thickening, or pericholecystic fluid. No evidence for choledocholithiasis. Common bile duct measures 5 mm diameter in the head of the pancreas. Pancreas: Mild distention of the main pancreatic duct identified in head of pancreas, measuring up to 5 mm diameter. Main pancreatic duct in the body and tail of pancreas is nondilated. Spleen:  No splenomegaly. No focal mass lesion. Adrenals/Urinary Tract: No adrenal nodule or mass. Mild to moderate right hydronephrosis is associated with right perinephric edema/fluid. This edema tracks caudally in the right retroperitoneal space down towards the pelvis (not included on this abdomen exam. Stomach/Bowel: Stomach is unremarkable. No gastric wall thickening. No evidence of outlet obstruction. Duodenum is normally positioned as is the ligament of Treitz. No small bowel or colonic dilatation within the visualized abdomen. Vascular/Lymphatic: No abdominal aortic aneurysm. No abdominal lymphadenopathy. Other:  No gross free fluid in the abdomen. Musculoskeletal: No abnormal marrow signal evident within the visualized bony anatomy. IMPRESSION: 1. No evidence for cholelithiasis or choledocholithiasis. No intra or extrahepatic biliary duct dilatation. 2. Mild to moderate right  hydroureteronephrosis with prominent right perinephric edema/fluid tracking caudally towards the pelvis. Etiology for the right urinary obstruction not evident on this noncontrast MRI. Stone disease or urothelial lesion would be considerations. 3. Mild distention of the main pancreatic duct in the head of pancreas without dilatation in the body or tail. Finding is indeterminate and no mass lesion is visible in the pancreatic head. Follow-up MRCP in 3-6 months could be used to re-evaluate. Renal function permitting, exam should be performed with  intravenous contrast. Electronically Signed   By: Kennith Center M.D.   On: 11/02/2018 09:21   Mr 3d Recon At Scanner  Result Date: 11/02/2018 CLINICAL DATA:  Possible cholelithiasis and choledocholithiasis on recent CT. EXAM: MRI ABDOMEN WITHOUT CONTRAST  (INCLUDING MRCP) TECHNIQUE: Multiplanar multisequence MR imaging of the abdomen was performed. Heavily T2-weighted images of the biliary and pancreatic ducts were obtained, and three-dimensional MRCP images were rendered by post processing. COMPARISON:  CT 11/01/2018 FINDINGS: Lower chest: Basilar atelectasis bilaterally. Hepatobiliary: No focal abnormality in the liver on this study without intravenous contrast. There is no evidence for gallstones, gallbladder wall thickening, or pericholecystic fluid. No evidence for choledocholithiasis. Common bile duct measures 5 mm diameter in the head of the pancreas. Pancreas: Mild distention of the main pancreatic duct identified in head of pancreas, measuring up to 5 mm diameter. Main pancreatic duct in the body and tail of pancreas is nondilated. Spleen:  No splenomegaly. No focal mass lesion. Adrenals/Urinary Tract: No adrenal nodule or mass. Mild to moderate right hydronephrosis is associated with right perinephric edema/fluid. This edema tracks caudally in the right retroperitoneal space down towards the pelvis (not included on this abdomen exam. Stomach/Bowel: Stomach is unremarkable. No gastric wall thickening. No evidence of outlet obstruction. Duodenum is normally positioned as is the ligament of Treitz. No small bowel or colonic dilatation within the visualized abdomen. Vascular/Lymphatic: No abdominal aortic aneurysm. No abdominal lymphadenopathy. Other:  No gross free fluid in the abdomen. Musculoskeletal: No abnormal marrow signal evident within the visualized bony anatomy. IMPRESSION: 1. No evidence for cholelithiasis or choledocholithiasis. No intra or extrahepatic biliary duct dilatation. 2. Mild to  moderate right hydroureteronephrosis with prominent right perinephric edema/fluid tracking caudally towards the pelvis. Etiology for the right urinary obstruction not evident on this noncontrast MRI. Stone disease or urothelial lesion would be considerations. 3. Mild distention of the main pancreatic duct in the head of pancreas without dilatation in the body or tail. Finding is indeterminate and no mass lesion is visible in the pancreatic head. Follow-up MRCP in 3-6 months could be used to re-evaluate. Renal function permitting, exam should be performed with intravenous contrast. Electronically Signed   By: Kennith Center M.D.   On: 11/02/2018 09:21   Ct Renal Stone Study  Result Date: 11/01/2018 CLINICAL DATA:  Right-sided flank pain beginning this morning with nausea and vomiting. EXAM: CT ABDOMEN AND PELVIS WITHOUT CONTRAST TECHNIQUE: Multidetector CT imaging of the abdomen and pelvis was performed following the standard protocol without IV contrast. COMPARISON:  None. FINDINGS: Lower chest: Mild linear atelectasis right base. Hepatobiliary: Possible subtle cholelithiasis. Liver is normal. 4 mm calcification projects over the porta hepatis which may be a common duct stone (similar finding was described on the prior report of CT 06/22/2002 as images not available). Pancreas: Normal. Spleen: Normal. Adrenals/Urinary Tract: Adrenal glands are normal. Kidneys are normal size. Nonobstructing punctate stone over the lower pole left kidney. Two small right renal stones are present with the larger measuring 3-4 mm over the  lower pole. Mild right-sided hydronephrosis and mild stranding of the right perinephric fat. No definite ureteral stone identified. Bladder is normal. Stomach/Bowel: Stomach and small bowel are within normal. Appendix not visualized. Minimal diverticulosis of the colon. Vascular/Lymphatic: Very minimal calcified plaque over the abdominal aorta. No adenopathy. Reproductive: Previous hysterectomy.  Other: No free peritoneal fluid.  No free peritoneal air. Musculoskeletal: Degenerative change of the spine and hips. IMPRESSION: Mild bilateral nephrolithiasis. There is right-sided hydronephrosis. No ureteral stones visualized. Findings may be due to recent passage of a stone versus ascending infection. 4 mm calcification over the porta hepatis which may represent a ductal stone (similar finding described on report of previous CT 2003 although no images available). Possible subtle cholelithiasis. No ductal dilatation. Minimal colonic diverticulosis. Aortic Atherosclerosis (ICD10-I70.0). Electronically Signed   By: Elberta Fortis M.D.   On: 11/01/2018 21:40    Scheduled Meds:  buPROPion  300 mg Oral Daily   metoprolol tartrate  2.5 mg Intravenous Once    Continuous Infusions:  sodium chloride Stopped (11/02/18 1042)   cefTRIAXone (ROCEPHIN)  IV       LOS: 0 days     Hollice Espy, MD [facility]  To reach me or the doctor on call, go to: [email or web address] Password [email or web address]  11/02/2018, 1:28 PM

## 2018-11-03 DIAGNOSIS — A4151 Sepsis due to Escherichia coli [E. coli]: Secondary | ICD-10-CM

## 2018-11-03 LAB — CBC
HCT: 33.2 % — ABNORMAL LOW (ref 36.0–46.0)
HCT: 32.8 % — ABNORMAL LOW (ref 36.0–46.0)
Hemoglobin: 10.8 g/dL — ABNORMAL LOW (ref 12.0–15.0)
Hemoglobin: 10.9 g/dL — ABNORMAL LOW (ref 12.0–15.0)
MCH: 26.7 pg (ref 26.0–34.0)
MCH: 27.2 pg (ref 26.0–34.0)
MCHC: 32.5 g/dL (ref 30.0–36.0)
MCHC: 33.2 g/dL (ref 30.0–36.0)
MCV: 82.2 fL (ref 80.0–100.0)
MCV: 81.8 fL (ref 80.0–100.0)
Platelets: 187 10*3/uL (ref 150–400)
Platelets: 156 10*3/uL (ref 150–400)
RBC: 4.04 MIL/uL (ref 3.87–5.11)
RBC: 4.01 MIL/uL (ref 3.87–5.11)
RDW: 15.5 % (ref 11.5–15.5)
RDW: 15.7 % — ABNORMAL HIGH (ref 11.5–15.5)
WBC: 18.5 10*3/uL — ABNORMAL HIGH (ref 4.0–10.5)
WBC: 17.2 10*3/uL — ABNORMAL HIGH (ref 4.0–10.5)
nRBC: 0 % (ref 0.0–0.2)
nRBC: 0 % (ref 0.0–0.2)

## 2018-11-03 LAB — PROCALCITONIN
Procalcitonin: 21.21 ng/mL
Procalcitonin: 19.02 ng/mL

## 2018-11-03 LAB — BASIC METABOLIC PANEL
Anion gap: 11 (ref 5–15)
BUN: 18 mg/dL (ref 8–23)
CO2: 20 mmol/L — ABNORMAL LOW (ref 22–32)
Calcium: 8.2 mg/dL — ABNORMAL LOW (ref 8.9–10.3)
Chloride: 111 mmol/L (ref 98–111)
Creatinine, Ser: 1.41 mg/dL — ABNORMAL HIGH (ref 0.44–1.00)
GFR calc Af Amer: 43 mL/min — ABNORMAL LOW (ref >60)
GFR calc non Af Amer: 37 mL/min — ABNORMAL LOW (ref >60)
Glucose, Bld: 131 mg/dL — ABNORMAL HIGH (ref 70–99)
Potassium: 3.4 mmol/L — ABNORMAL LOW (ref 3.5–5.1)
Sodium: 142 mmol/L (ref 135–145)

## 2018-11-03 LAB — LACTIC ACID, PLASMA: Lactic Acid, Venous: 1.1 mmol/L (ref 0.5–1.9)

## 2018-11-03 LAB — URINE CULTURE: Culture: >=100000 — AB

## 2018-11-03 LAB — GLUCOSE, CAPILLARY
Glucose-Capillary: 125 mg/dL — ABNORMAL HIGH (ref 70–99)
Glucose-Capillary: 127 mg/dL — ABNORMAL HIGH (ref 70–99)
Glucose-Capillary: 136 mg/dL — ABNORMAL HIGH (ref 70–99)
Glucose-Capillary: 107 mg/dL — ABNORMAL HIGH (ref 70–99)

## 2018-11-03 MED ORDER — SODIUM CHLORIDE 0.9 % IV SOLN
INTRAVENOUS | Status: DC | PRN
  Administered 2018-11-03: 250 mL via INTRAVENOUS

## 2018-11-03 MED ORDER — PIPERACILLIN-TAZOBACTAM 3.375 G IVPB
3.3750 g | Freq: Three times a day (TID) | INTRAVENOUS | Status: DC
  Administered 2018-11-03 – 2018-11-05 (×6): 3.375 g via INTRAVENOUS
  Filled 2018-11-03 (×7): qty 50

## 2018-11-03 MED ORDER — LIVING WELL WITH DIABETES BOOK
Freq: Once | Status: AC
  Administered 2018-11-03: 12:00:00

## 2018-11-03 NOTE — Progress Notes (Signed)
PROGRESS NOTE  Emily Benton OTL:[id number] DOB: Dec 14, 1945 DOA: 11/01/2018 PCP: Renford Dills, MD  HPI/Recap of past 14 hours: 73 year old female with past medical history of hypertension and depression presented to the emergency room on the night of 4/5 with complaints of right flank pain plus nausea and vomiting x2 days.  Patient found to have a large UTI and also signs of Sirs with tachycardia and mild fever.  Started on IV fluids and Rocephin.  CT scan noted right-sided hydronephrosis with possible passed stone or ascending infection.  Is also question of ductal stone which was ruled out by MRCP.  Patient initially brought in for observation  Following arrival to floor on the early morning of 4/6, she started to become more tachycardic and blood pressure dropped in the 140s down to the 100s despite continuous IV fluids.  Lactic acid level following arrival to floor elevated at 2.5.  Concerns for early sepsis.  Patient given fluid bolus and follow-up lactic acid level several hours later down to 1.9.  Further lactic acid level continue to trend downward.  Today, lactic acid level 1.1 although procalcitonin level jumped from 7.82 on admission To 21.2 today.  White count increased from 9 to 18 and creatinine increased to 1.41.  No further fever and patient is clinically feeling better.  Blood culture still pending but urine culture positive for pansensitive E. coli.  Assessment/Plan: Principal Problem: Sepsis secondary to pyelonephritis of right kidney: Noted early hydronephrosis, but no evidence of stone obstruction.  Would favor continuing IV of antibiotics and IV fluids.  Patient meets criteria for sepsis at admission given her fever, leukocytosis, lactic acidosis and borderline hypotension.  Lactic acid level resolved so sepsis felt to be treated.  Despite pansensitive E. coli in urine, white count and procalcitonin worse although patient has no respiratory symptoms.  Will change  antibiotics to IV Zosyn regardless until both CBC and procalcitonin trending downward.  Monitor blood cultures.  Active Problems:  Diabetes mellitus, type II, new onset: Noted of some hyperglycemia.  A1c checked and found to be elevated at 6.6.  No previous history of.  Discussed with diabetes coordinator.  Will be starting on sliding scale.  Acute kidney injury [facility]): Secondary to early sepsis, although actually getting worse despite IV fluids.  Continue IV fluids and recheck labs in the morning   Code Status: Full code  Family Communication: Left message for husband  Disposition Plan: Home once white count and renal function normalized and infection felt to be stabilized   Consultants:  Urology-discussed by phone with Dr. Fayrene Fearing  Procedures:  None  Antimicrobials:  IV Rocephin 4/5- 4/7  IV Zosyn 4/7-present  DVT prophylaxis: SCDs   Objective: Vitals:   11/03/18 0331 11/03/18 0925  BP: 116/75 110/70  Pulse: (!) 114 (!) 105  Resp: 20 18  Temp: 99.1 F (37.3 C) 98.7 F (37.1 C)  SpO2: 95% 92%    Intake/Output Summary (Last 24 hours) at 11/03/2018 1411 Last data filed at 11/03/2018 1127 Gross per 24 hour  Intake 4350 ml  Output 1220 ml  Net 3130 ml   Filed Weights   11/02/18 0012 11/02/18 0117 11/02/18 2030  Weight: 66 kg 66 kg 66.1 kg   Body mass index is 29.43 kg/m.  Exam:   General: Alert and oriented x3, no acute distress  HEENT: Normocephalic and atraumatic, mucous memories are slightly dry  Neck: Supple, no JVD  Cardiovascular: Regular rhythm, borderline tachycardia  Respiratory: Clear to auscultation bilaterally  Abdomen: Soft,  nontender, nondistended, positive bowel sounds  Back: Minimal, improving right flank tenderness  Musculoskeletal: No clubbing or cyanosis or edema  Skin: No skin breaks, tears or lesions  Neuro: No focal deficits  Psychiatry: Appropriate, no evidence of psychoses   Data Reviewed: CBC: Recent Labs  Lab  11/01/18 2101 11/02/18 0504 11/02/18 1428 11/03/18 0642  WBC 18.2* 17.2* 9.0 18.5*  NEUTROABS 16.5* 15.1*  --   --   HGB 14.0 11.9* 11.2* 10.8*  HCT 43.6 37.0 35.7* 33.2*  MCV 82.7 83.0 82.6 82.2  PLT 303 249 227 187   Basic Metabolic Panel: Recent Labs  Lab 11/01/18 2101 11/02/18 0504 11/03/18 0642  NA 138 139 142  K 3.4* 3.0* 3.4*  CL 100 106 111  CO2 25 21* 20*  GLUCOSE 178* 138* 131*  BUN CREATININE 1.43* 1.29* 1.41*  CALCIUM 9.2 8.4* 8.2*   GFR: Estimated Creatinine Clearance: 29.8 mL/min (A) (by C-G formula based on SCr of 1.41 mg/dL (H)). Liver Function Tests: Recent Labs  Lab 11/01/18 2101 11/02/18 0504  AST 24 20  ALT 21 17  ALKPHOS 75 58  BILITOT 0.9 0.6  PROT 6.8 5.3*  ALBUMIN 4.0 3.0*   Recent Labs  Lab 11/01/18 2101  LIPASE 20   No results for input(s): AMMONIA in the last 168 hours. Coagulation Profile: No results for input(s): INR, PROTIME in the last 168 hours. Cardiac Enzymes: No results for input(s): CKTOTAL, CKMB, CKMBINDEX, TROPONINI in the last 168 hours. BNP (last 3 results) No results for input(s): PROBNP in the last 8760 hours. HbA1C: Recent Labs    11/02/18 0504  HGBA1C 6.6*   CBG: Recent Labs  Lab 11/02/18 1631 11/02/18 2030 11/03/18 0708 11/03/18 1106  GLUCAP 151* 127* 125* 127*   Lipid Profile: No results for input(s): CHOL, HDL, LDLCALC, TRIG, CHOLHDL, LDLDIRECT in the last 72 hours. Thyroid Function Tests: Recent Labs    11/02/18 0554  TSH 0.729   Anemia Panel: No results for input(s): VITAMINB12, FOLATE, FERRITIN, TIBC, IRON, RETICCTPCT in the last 72 hours. Urine analysis:    Component Value Date/Time   COLORURINE YELLOW 11/01/2018 2216   APPEARANCEUR HAZY (A) 11/01/2018 2216   LABSPEC 1.021 11/01/2018 2216   PHURINE 5.0 11/01/2018 2216   GLUCOSEU NEGATIVE 11/01/2018 2216   HGBUR MODERATE (A) 11/01/2018 2216   BILIRUBINUR NEGATIVE 11/01/2018 2216   KETONESUR 5 (A) 11/01/2018 2216    PROTEINUR NEGATIVE 11/01/2018 2216   NITRITE POSITIVE (A) 11/01/2018 2216   LEUKOCYTESUR MODERATE (A) 11/01/2018 2216   Sepsis Labs: (procalcitonin:4,lacticidven:4)  ) Recent Results (from the past 240 hour(s))  Urine culture     Status: Abnormal   Collection Time: 11/01/18 10:16 PM  Result Value Ref Range Status   Specimen Description URINE, RANDOM  Final   Special Requests   Final    NONE Performed at [facility], [address] [place], [place] [address]    Culture >=100,000 COLONIES/mL ESCHERICHIA COLI (A)  Final   Report Status 11/03/2018 FINAL  Final   Organism ID, Bacteria ESCHERICHIA COLI (A)  Final      Susceptibility   Escherichia coli - MIC*    AMPICILLIN 16 INTERMEDIATE Intermediate     CEFAZOLIN <=4 SENSITIVE Sensitive     CEFTRIAXONE <=1 SENSITIVE Sensitive     CIPROFLOXACIN <=0.25 SENSITIVE Sensitive     GENTAMICIN <=1 SENSITIVE Sensitive     IMIPENEM <=0.25 SENSITIVE Sensitive     NITROFURANTOIN <=16 SENSITIVE Sensitive  TRIMETH/SULFA <=20 SENSITIVE Sensitive     AMPICILLIN/SULBACTAM 4 SENSITIVE Sensitive     PIP/TAZO <=4 SENSITIVE Sensitive     Extended ESBL NEGATIVE Sensitive     * >=100,000 COLONIES/mL ESCHERICHIA COLI  Culture, blood (routine x 2)     Status: None (Preliminary result)   Collection Time: 11/02/18  5:54 AM  Result Value Ref Range Status   Specimen Description BLOOD RIGHT HAND  Final   Special Requests   Final    BOTTLES DRAWN AEROBIC ONLY Blood Culture adequate volume   Culture   Final    NO GROWTH 1 DAY Performed at [facility], [address] [place], [place] [address]    Report Status PENDING  Incomplete  Culture, blood (routine x 2)     Status: None (Preliminary result)   Collection Time: 11/02/18  5:54 AM  Result Value Ref Range Status   Specimen Description BLOOD LEFT ANTECUBITAL  Final   Special Requests   Final    BOTTLES DRAWN AEROBIC ONLY Blood Culture adequate volume   Culture   Final    NO  GROWTH 1 DAY Performed at [facility], [address] [place], [place] [address]    Report Status PENDING  Incomplete      Studies: No results found.  Scheduled Meds: . buPROPion  300 mg Oral Daily  . insulin aspart  0-9 Units Subcutaneous TID WC    Continuous Infusions: . piperacillin-tazobactam (ZOSYN)  IV 3.375 g (11/03/18 1125)     LOS: 1 day     Hollice EspySendil K Javon Hupfer, MD [facility]  To reach me or the doctor on call, go to: [email or web address] Password [email or web address]  11/03/2018, 2:11 PM

## 2018-11-03 NOTE — Progress Notes (Signed)
Inpatient Diabetes Program Recommendations  AACE/ADA: New Consensus Statement on Inpatient Glycemic Control (2015)  Target Ranges:  Prepandial:   less than 140 mg/dL      Peak postprandial:   less than 180 mg/dL (1-2 hours)      Critically ill patients:  140 - 180 mg/dL   Lab Results  Component Value Date   GLUCAP 125 (H) 11/03/2018   HGBA1C 6.6 (H) 11/02/2018    Diabetes history: New onset DM Outpatient Diabetes medications: none Current ordersl: Novolog 0-9 units TID  Inpatient Diabetes Program Recommendations:    Spoke with patient regarding new onset diabetes diagnosis.  Reviewed patient's current A1c of 6.6%. Explained what a A1c is and what it measures. Also reviewed goal A1c with patient, importance of good glucose control @ home, and blood sugar goals. Briefly reviewed patho of DM, role of pancreas, vascular changes and comorbidites. Patient will need a meter at discharge. Blood glucose meter kit (includes lancets and strips). ([id number]) Encouraged patient to learn how to check CBGs while inpatient and begin checking QD, then follow up with PCP. Patient admits to drinking sodas. We discussed the impact of sugary beverages to blood glucose levels and encouraged mindfulness moving forward and choosing beverages with lower sugar content. Patient thankful for information and agreeing. Will order LWWDM. Patient has no further questions at this time.   Thanks, Bronson Curb, MSN, RNC-OB Diabetes Coordinator [phone] (8a-5p)

## 2018-11-03 NOTE — Progress Notes (Signed)
Pharmacy Antibiotic Note  Emily Benton is a 73 y.o. female admitted on 11/01/2018. Treating for pyelo and now broadening to Zosyn.  Plan: Start Zosyn 3.375 gm IV q8h (4 hour infusion) Monitor clinical picture, renal function F/U C&S, abx deescalation / LOT  Height: 4\' 11"  (149.9 cm) Weight: 145 lb 11.6 oz (66.1 kg) IBW/kg (Calculated) : 43.2  Temp (24hrs), Avg:98.8 F (37.1 C), Min:98.6 F (37 C), Max:99.1 F (37.3 C)  Recent Labs  Lab 11/01/18 2101 11/02/18 0504  11/02/18 0844 11/02/18 1221 11/02/18 1428 11/02/18 1806 11/02/18 2130 11/03/18 0642  WBC 18.2* 17.2*  --   --   --  9.0  --   --  18.5*  CREATININE 1.43* 1.29*  --   --   --   --   --   --  1.41*  LATICACIDVEN  --   --    < > 2.5* 1.9 1.6 2.3* 1.7  --    < > = values in this interval not displayed.    Estimated Creatinine Clearance: 29.8 mL/min (A) (by C-G formula based on SCr of 1.41 mg/dL (H)).    Allergies  Allergen Reactions  . Codeine Nausea And Vomiting    Thank you for allowing pharmacy to be a part of this patient's care.  Armandina Stammer 11/03/2018 10:24 AM

## 2018-11-04 ENCOUNTER — Inpatient Hospital Stay (HOSPITAL_COMMUNITY): Payer: Medicare Other

## 2018-11-04 DIAGNOSIS — R6510 Systemic inflammatory response syndrome (SIRS) of non-infectious origin without acute organ dysfunction: Secondary | ICD-10-CM

## 2018-11-04 LAB — BASIC METABOLIC PANEL
Anion gap: 13 (ref 5–15)
BUN: 20 mg/dL (ref 8–23)
CO2: 19 mmol/L — ABNORMAL LOW (ref 22–32)
Calcium: 8.2 mg/dL — ABNORMAL LOW (ref 8.9–10.3)
Chloride: 106 mmol/L (ref 98–111)
Creatinine, Ser: 1.33 mg/dL — ABNORMAL HIGH (ref 0.44–1.00)
GFR calc Af Amer: 46 mL/min — ABNORMAL LOW (ref >60)
GFR calc non Af Amer: 40 mL/min — ABNORMAL LOW (ref >60)
Glucose, Bld: 146 mg/dL — ABNORMAL HIGH (ref 70–99)
Potassium: 3.3 mmol/L — ABNORMAL LOW (ref 3.5–5.1)
Sodium: 138 mmol/L (ref 135–145)

## 2018-11-04 LAB — CBC
HCT: 33.9 % — ABNORMAL LOW (ref 36.0–46.0)
Hemoglobin: 11 g/dL — ABNORMAL LOW (ref 12.0–15.0)
MCH: 26.2 pg (ref 26.0–34.0)
MCHC: 32.4 g/dL (ref 30.0–36.0)
MCV: 80.7 fL (ref 80.0–100.0)
Platelets: 167 10*3/uL (ref 150–400)
RBC: 4.2 MIL/uL (ref 3.87–5.11)
RDW: 15.5 % (ref 11.5–15.5)
WBC: 7.3 10*3/uL (ref 4.0–10.5)
nRBC: 0 % (ref 0.0–0.2)

## 2018-11-04 LAB — GLUCOSE, CAPILLARY
Glucose-Capillary: 159 mg/dL — ABNORMAL HIGH (ref 70–99)
Glucose-Capillary: 111 mg/dL — ABNORMAL HIGH (ref 70–99)
Glucose-Capillary: 151 mg/dL — ABNORMAL HIGH (ref 70–99)
Glucose-Capillary: 138 mg/dL — ABNORMAL HIGH (ref 70–99)

## 2018-11-04 LAB — LACTIC ACID, PLASMA: Lactic Acid, Venous: 1.9 mmol/L (ref 0.5–1.9)

## 2018-11-04 MED ORDER — POTASSIUM CHLORIDE CRYS ER 20 MEQ PO TBCR
30.0000 meq | EXTENDED_RELEASE_TABLET | Freq: Once | ORAL | Status: AC
  Administered 2018-11-04: 30 meq via ORAL
  Filled 2018-11-04: qty 1

## 2018-11-04 MED ORDER — SODIUM CHLORIDE 0.9 % IV SOLN
INTRAVENOUS | Status: DC
  Administered 2018-11-04 – 2018-11-05 (×2): via INTRAVENOUS

## 2018-11-04 NOTE — Progress Notes (Signed)
   11/03/18 2332  Vitals  Temp (!) 102.9 F (39.4 C)  Temp Source Oral  BP (!) 171/92  MAP (mmHg) 115  BP Method Automatic  Pulse Rate (!) 124  Patient had sustained heart rate i9n the 130s to 140s with chills. Vital signs were obtained and Craige Cotta, NP was text paged. Awaiting for orders.

## 2018-11-04 NOTE — Consult Note (Signed)
Urology Consult   Physician requesting consult: Dr. Carmell Austria  Reason for consult:  Right hydronephrosis and pyelonephritis  History of Present Illness: Emily Benton is a 73 y.o. who developed the acute onset of severe right-sided flank and upper abdominal pain on Sunday.  This was associated with nausea and vomiting.  She denied any fever at that time.  She presented to the hospital and underwent a CT scan that demonstrated right-sided hydronephrosis without dilation of the ureter.  She did have a nonobstructing lower pole right renal calculus but otherwise no obstructing ureteral calculi.  She also developed a fever and a urine culture subsequently was positive for pansensitive E. coli.  She has been treated with Zosyn.  Clinically, she has improved over the last 48 hours.  She is still having right-sided flank and abdominal pain although this is significantly improved.  Her white blood count has normalized.  However, she did spike a fever to 102.9 F last evening.  This prompted urologic consultation today.  She did have imaging yesterday with an MRI to further evaluate her bile duct dilation.  This did not demonstrate evidence of biliary tract obstruction.  She underwent a repeat renal ultrasound today but did demonstrate persistent right-sided hydronephrosis.  Although she denies a prior history of urolithiasis, I was able to look back at her prior records.  In 2004, she was seen by Dr. Brunilda Payor and her imaging reports with CT imaging and ultrasound did demonstrate evidence of a nonobstructing right renal calculus without ureteral calculi.  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  Past Medical History:  Diagnosis Date  . Hypertension     Past Surgical History:  Procedure Laterality Date  . ABDOMINAL HYSTERECTOMY       Current Hospital Medications:  Home meds:  No current  facility-administered medications on file prior to encounter.    Current Outpatient Medications on File Prior to Encounter  Medication Sig Dispense Refill  . acetaminophen (TYLENOL) 500 MG tablet Take 500-1,000 mg by mouth every 6 (six) hours as needed for mild pain or headache.     . Aspirin-Acetaminophen-Caffeine (GOODY HEADACHE PO) Take 1 packet by mouth as needed (for headaches or pain).    Marland Kitchen buPROPion (WELLBUTRIN XL) 300 MG 24 hr tablet Take 300 mg by mouth daily.    . hydrochlorothiazide (HYDRODIURIL) 25 MG tablet Take 25 mg by mouth daily.    Marland Kitchen LORazepam (ATIVAN) 0.5 MG tablet Take 0.5 mg by mouth daily as needed for anxiety.    . clindamycin (CLEOCIN) 300 MG capsule Take 1 capsule (300 mg total) by mouth 4 (four) times daily. X 7 days (Patient not taking: Reported on 11/01/2018) 28 capsule 0  . oxyCODONE-acetaminophen (PERCOCET/ROXICET) 5-325 MG per tablet Take 1 tablet by mouth every 6 (six) hours as needed for moderate pain or severe pain. (Patient not taking: Reported on 11/01/2018) 15 tablet 0     Scheduled Meds: . buPROPion  300 mg Oral Daily  . insulin aspart  0-9 Units Subcutaneous TID WC   Continuous Infusions: . sodium chloride 250 mL (11/03/18 2013)  . sodium chloride 75 mL/hr at 11/04/18 0831  . piperacillin-tazobactam (ZOSYN)  IV 3.375 g (11/04/18 1208)   PRN Meds:.sodium chloride, acetaminophen **OR** acetaminophen, hydrALAZINE, LORazepam, morphine injection, ondansetron **OR** ondansetron (ZOFRAN) IV  Allergies:  Allergies  Allergen Reactions  . Codeine Nausea And Vomiting    Family History  Problem  Relation Age of Onset  . Diabetes Mellitus II Mother   . Diabetes Mellitus II Father   . CAD Neg Hx     Social History:  reports that she has quit smoking. She has never used smokeless tobacco. She reports that she does not drink alcohol or use drugs.  ROS: A complete review of systems was performed.  All systems are negative except for pertinent findings as  noted.  Physical Exam:  Vital signs in last 24 hours: Temp:  [98.7 F (37.1 C)-102.9 F (39.4 C)] 98.7 F (37.1 C) (04/08 0931) Pulse Rate:  [108-124] 109 (04/08 0931) Resp:  [17-20] 20 (04/08 0931) BP: (115-171)/(77-92) 120/80 (04/08 0931) SpO2:  [88 %-96 %] 96 % (04/08 0931) Weight:  [66.1 kg] 66.1 kg (04/07 2113) Constitutional:  Alert and oriented, No acute distress Cardiovascular: Regular rate with mild tachycardia, No JVD Respiratory: Normal respiratory effort GI: Abdomen is soft, nondistended, no abdominal masses, Mild RUQ tenderness GU: Mild R CVA tenderness Lymphatic: No lymphadenopathy Neurologic: Grossly intact, no focal deficits Psychiatric: Normal mood and affect  Laboratory Data:  Recent Labs    11/02/18 0504 11/02/18 1428 11/03/18 0642 11/03/18 1429 11/04/18 0022  WBC 17.2* 9.0 18.5* 17.2* 7.3  HGB 11.9* 11.2* 10.8* 10.9* 11.0*  HCT 37.0 35.7* 33.2* 32.8* 33.9*  PLT 249 227 187 156 167    Recent Labs    11/01/18 2101 11/02/18 0504 11/03/18 0642 11/04/18 0022  NA 138 139 142 138  K 3.4* 3.0* 3.4* 3.3*  CL 100 106 111 106  GLUCOSE 178* 138* 131* 146*  BUN 19 15 18 20   CALCIUM 9.2 8.4* 8.2* 8.2*  CREATININE 1.43* 1.29* 1.41* 1.33*     Results for orders placed or performed during the hospital encounter of 11/01/18 (from the past 24 hour(s))  CBC     Status: Abnormal   Collection Time: 11/03/18  2:29 PM  Result Value Ref Range   WBC 17.2 (H) 4.0 - 10.5 K/uL   RBC 4.01 3.87 - 5.11 MIL/uL   Hemoglobin 10.9 (L) 12.0 - 15.0 g/dL   HCT 34.3 (L) 56.8 - 61.6 %   MCV 81.8 80.0 - 100.0 fL   MCH 27.2 26.0 - 34.0 pg   MCHC 33.2 30.0 - 36.0 g/dL   RDW 83.7 (H) 29.0 - 21.1 %   Platelets 156 150 - 400 K/uL   nRBC 0.0 0.0 - 0.2 %  Procalcitonin - Baseline     Status: None   Collection Time: 11/03/18  2:29 PM  Result Value Ref Range   Procalcitonin 19.02 ng/mL  Glucose, capillary     Status: Abnormal   Collection Time: 11/03/18  4:24 PM  Result Value  Ref Range   Glucose-Capillary 136 (H) 70 - 99 mg/dL  Glucose, capillary     Status: Abnormal   Collection Time: 11/03/18  9:14 PM  Result Value Ref Range   Glucose-Capillary 107 (H) 70 - 99 mg/dL  CBC     Status: Abnormal   Collection Time: 11/04/18 12:22 AM  Result Value Ref Range   WBC 7.3 4.0 - 10.5 K/uL   RBC 4.20 3.87 - 5.11 MIL/uL   Hemoglobin 11.0 (L) 12.0 - 15.0 g/dL   HCT 15.5 (L) 20.8 - 02.2 %   MCV 80.7 80.0 - 100.0 fL   MCH 26.2 26.0 - 34.0 pg   MCHC 32.4 30.0 - 36.0 g/dL   RDW 33.6 12.2 - 44.9 %   Platelets 167 150 - 400 K/uL  nRBC 0.0 0.0 - 0.2 %  Basic metabolic panel     Status: Abnormal   Collection Time: 11/04/18 12:22 AM  Result Value Ref Range   Sodium 138 135 - 145 mmol/L   Potassium 3.3 (L) 3.5 - 5.1 mmol/L   Chloride 106 98 - 111 mmol/L   CO2 19 (L) 22 - 32 mmol/L   Glucose, Bld 146 (H) 70 - 99 mg/dL   BUN 20 8 - 23 mg/dL   Creatinine, Ser 4.091.33 (H) 0.44 - 1.00 mg/dL   Calcium 8.2 (L) 8.9 - 10.3 mg/dL   GFR calc non Af Amer 40 (L) >60 mL/min   GFR calc Af Amer 46 (L) >60 mL/min   Anion gap 13 5 - 15  Lactic acid, plasma     Status: None   Collection Time: 11/04/18 12:22 AM  Result Value Ref Range   Lactic Acid, Venous 1.9 0.5 - 1.9 mmol/L  Glucose, capillary     Status: Abnormal   Collection Time: 11/04/18  7:30 AM  Result Value Ref Range   Glucose-Capillary 159 (H) 70 - 99 mg/dL  Glucose, capillary     Status: Abnormal   Collection Time: 11/04/18 11:10 AM  Result Value Ref Range   Glucose-Capillary 111 (H) 70 - 99 mg/dL   Recent Results (from the past 240 hour(s))  Urine culture     Status: Abnormal   Collection Time: 11/01/18 10:16 PM  Result Value Ref Range Status   Specimen Description URINE, RANDOM  Final   Special Requests   Final    NONE Performed at [facility], [address] [place], [place] [address]    Culture >=100,000 COLONIES/mL ESCHERICHIA COLI (A)  Final   Report Status 11/03/2018 FINAL  Final   Organism ID,  Bacteria ESCHERICHIA COLI (A)  Final      Susceptibility   Escherichia coli - MIC*    AMPICILLIN 16 INTERMEDIATE Intermediate     CEFAZOLIN <=4 SENSITIVE Sensitive     CEFTRIAXONE <=1 SENSITIVE Sensitive     CIPROFLOXACIN <=0.25 SENSITIVE Sensitive     GENTAMICIN <=1 SENSITIVE Sensitive     IMIPENEM <=0.25 SENSITIVE Sensitive     NITROFURANTOIN <=16 SENSITIVE Sensitive     TRIMETH/SULFA <=20 SENSITIVE Sensitive     AMPICILLIN/SULBACTAM 4 SENSITIVE Sensitive     PIP/TAZO <=4 SENSITIVE Sensitive     Extended ESBL NEGATIVE Sensitive     * >=100,000 COLONIES/mL ESCHERICHIA COLI  Culture, blood (routine x 2)     Status: None (Preliminary result)   Collection Time: 11/02/18  5:54 AM  Result Value Ref Range Status   Specimen Description BLOOD RIGHT HAND  Final   Special Requests   Final    BOTTLES DRAWN AEROBIC ONLY Blood Culture adequate volume   Culture   Final    NO GROWTH 2 DAYS Performed at [facility], [address] [place], [place] [address]    Report Status PENDING  Incomplete  Culture, blood (routine x 2)     Status: None (Preliminary result)   Collection Time: 11/02/18  5:54 AM  Result Value Ref Range Status   Specimen Description BLOOD LEFT ANTECUBITAL  Final   Special Requests   Final    BOTTLES DRAWN AEROBIC ONLY Blood Culture adequate volume   Culture   Final    NO GROWTH 2 DAYS Performed at [facility], [address] [place], [place] [address]    Report Status PENDING  Incomplete  Renal Function: Recent Labs    11/01/18 2101 11/02/18 0504 11/03/18 0642 11/04/18 0022  CREATININE 1.43* 1.29* 1.41* 1.33*   Estimated Creatinine Clearance: 31.6 mL/min (A) (by C-G formula based on SCr of 1.33 mg/dL (H)).  Radiologic Imaging: US Renal  Result Date: 11/04/2018 CLINICAL DATA:  Right hydronephrosis. EXAM: RENAL / URINARY TRACT ULTRASOUND COMPLETE COMPARISON:  CT of the abdomen and pelvis without contrast on 11/01/2018 and MRI of the abdomen on  11/02/2018. FINDINGS: Right Kidney: Renal measurements: 10.8 x 5.5 x 6.0 cm = volume: 185 mL. Persistent mild to moderate hydronephrosis. No masses. Left Kidney: Renal measurements: 11.2 x 5.3 x 5.4 cm = volume: 168 mL. Echogenicity within normal limits. No mass or hydronephrosis visualized. Bladder: Appears normal for degree of bladder distention. Left ureteral jet visualized. A right ureteral jet is not seen. IMPRESSION: 1. Persistent mild to moderate right-sided hydronephrosis. 2. Lack of visualization of a right ureteral jet. On review of the prior CT of the pelvis, there may be a tiny calculus in the distal right ureter. It may be worth repeating a CT of the abdomen and pelvis without contrast in determining if there is truly a distal ureteral calculus and whether it has migrated. Electronically Signed   By: Irish Lack M.D.   On: 11/04/2018 09:49   Dg Chest Port 1 View  Result Date: 11/04/2018 CLINICAL DATA:  Fever. Hypoxia. EXAM: PORTABLE CHEST 1 VIEW COMPARISON:  Lung bases from abdominal CT 11/01/2018 FINDINGS: Low lung volumes. Mild bibasilar atelectasis. Possible trace pleural effusions. Heart size is normal for technique. No pulmonary edema. No pneumothorax. IMPRESSION: Low lung volumes with bibasilar atelectasis. Possible trace pleural effusions. Electronically Signed   By: Narda Rutherford M.D.   On: 11/04/2018 00:19    I independently reviewed the above imaging studies.  Impression/Recommendation: 1.  Right pyelonephritis with right hydronephrosis: Continue appropriate culture sensitive antibiotic therapy for her presumed right pyelonephritis.  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  I do not think she likely has a distal calculus despite her ultrasound reading from today as she does not have any evidence of ureteral dilation.  Clinically, she appears to be improving.  As such, I do think that she can continue with antibiotic  therapy and undergo continued observation.  If she continues to clinically improve without evidence of an unresolving infection (understanding that she still may periodically spike fevers with pyelonephritis although her fever curve should improve), she can likely complete a 10 to 14-day course of antibiotic therapy.  However, if she does not appear to be clearing her infection, it may be best to consider right ureteral stent placement I did discuss this option with her today.  I would recommend that we keep her n.p.o. after midnight tonight.  I will reevaluate her in the morning and consider proceeding with right ureteral stent placement if indicated.  Crecencio Mc 11/04/2018, 12:43 PM  Moody Bruins. MD   CC: Dr. Carmell Austria

## 2018-11-04 NOTE — Progress Notes (Signed)
PROGRESS NOTE    Emily Benton  ZOX:[id number] DOB: 06-07-1946 DOA: 11/01/2018 PCP: Renford DillsPolite, Ronald, MD    Brief Narrative: 73 year old with past medical history significant for hypertension, depression who presented emergency department complaining of right-sided flank pain, nausea and vomiting for 2 days prior to admission.  Patient was found to have UTI and Sirs criteria.  CT scan noted right side hydronephrosis with possible passed a stone or ascending infection.  There was also question of ductal stone which was ruled out by MRCP.  Assessment & Plan:   Principal Problem:   SIRS (systemic inflammatory response syndrome) (HCC) Active Problems:   Pyelonephritis of right kidney   ARF (acute renal failure) (HCC)   Sepsis (HCC)   New onset type 2 diabetes mellitus (HCC)   1-sepsis secondary to pyelonephritis the right kidney.  Right side hydronephrosis Continue with IV fluids, IV antibiotics.  Urine culture growing E. Coli. Patient was changed to IV Zosyn due to increasing procalcitonin level and white count. She spike a fever on 4/7.  Urology consulted to evaluate for possible obstructive uropathy Plan to get renal ultrasound. Ultrasound showed possible UB J obstruction  DM s: Continue with a sliding scale insulin. Hemoglobin A1c at 6.6.  AKI: Related to infection, improved continue with IV fluids.    Hypokalemia; replete orally  Estimated body mass index is 29.43 kg/m as calculated from the following:   Height as of this encounter: 4\' 11"  (1.499 m).   Weight as of this encounter: 66.1 kg.   DVT prophylaxis: SCD Code Status: Full code Family Communication: husband didn't answer phone Disposition Plan: remain in the hospital for IV fluids.   Consultants:   Urology    Procedures:   Renal US;    Antimicrobials:  Zosyn    Subjective: Still complaining of left flank pain.    Objective: Vitals:   11/03/18 2332 11/04/18 0416 11/04/18 0549 11/04/18  0931  BP: (!) 171/92 115/77  120/80  Pulse: (!) 124 (!) 108 (!) 111 (!) 109  Resp:  17  20  Temp: (!) 102.9 F (39.4 C) 98.7 F (37.1 C)  98.7 F (37.1 C)  TempSrc: Oral Oral  Oral  SpO2: (!) 88% 96% 94% 96%  Weight:      Height:        Intake/Output Summary (Last 24 hours) at 11/04/2018 1435 Last data filed at 11/04/2018 1000 Gross per 24 hour  Intake 730.7 ml  Output 650 ml  Net 80.7 ml   Filed Weights   11/02/18 0117 11/02/18 2030 11/03/18 2113  Weight: 66 kg 66.1 kg 66.1 kg    Examination:  General exam: Appears calm and comfortable  Respiratory system: Clear to auscultation. Respiratory effort normal. Cardiovascular system: S1 & S2 heard, RRR. No JVD, murmurs, rubs, gallops or clicks. No pedal edema. Gastrointestinal system: Abdomen is nondistended, soft and nontender. No organomegaly or masses felt. Normal bowel sounds heard. Central nervous system: Alert and oriented. No focal neurological deficits. Extremities: Symmetric 5 x 5 power. Skin: No rashes, lesions or ulcers Psychiatry: Judgement and insight appear normal. Mood & affect appropriate.     Data Reviewed: I have personally reviewed following labs and imaging studies  CBC: Recent Labs  Lab 11/01/18 2101 11/02/18 0504 11/02/18 1428 11/03/18 0642 11/03/18 1429 11/04/18 0022  WBC 18.2* 17.2* 9.0 18.5* 17.2* 7.3  NEUTROABS 16.5* 15.1*  --   --   --   --   HGB 14.0 11.9* 11.2* 10.8* 10.9* 11.0*  HCT 43.6  37.0 35.7* 33.2* 32.8* 33.9*  MCV 82.7 83.0 82.6 82.2 81.8 80.7  PLT 303 249 227 187 156 167   Basic Metabolic Panel: Recent Labs  Lab 11/01/18 2101 11/02/18 0504 11/03/18 0642 11/04/18 0022  NA 138 139 142 138  K 3.4* 3.0* 3.4* 3.3*  CL 100 106 111 106  CO2 25 21* 20* 19*  GLUCOSE 178* 138* 131* 146*  BUN CREATININE 1.43* 1.29* 1.41* 1.33*  CALCIUM 9.2 8.4* 8.2* 8.2*   GFR: Estimated Creatinine Clearance: 31.6 mL/min (A) (by C-G formula based on SCr of 1.33 mg/dL (H)). Liver  Function Tests: Recent Labs  Lab 11/01/18 2101 11/02/18 0504  AST 24 20  ALT 21 17  ALKPHOS 75 58  BILITOT 0.9 0.6  PROT 6.8 5.3*  ALBUMIN 4.0 3.0*   Recent Labs  Lab 11/01/18 2101  LIPASE 20   No results for input(s): AMMONIA in the last 168 hours. Coagulation Profile: No results for input(s): INR, PROTIME in the last 168 hours. Cardiac Enzymes: No results for input(s): CKTOTAL, CKMB, CKMBINDEX, TROPONINI in the last 168 hours. BNP (last 3 results) No results for input(s): PROBNP in the last 8760 hours. HbA1C: Recent Labs    11/02/18 0504  HGBA1C 6.6*   CBG: Recent Labs  Lab 11/03/18 1106 11/03/18 1624 11/03/18 2114 11/04/18 0730 11/04/18 1110  GLUCAP 127* 136* 107* 159* 111*   Lipid Profile: No results for input(s): CHOL, HDL, LDLCALC, TRIG, CHOLHDL, LDLDIRECT in the last 72 hours. Thyroid Function Tests: Recent Labs    11/02/18 0554  TSH 0.729   Anemia Panel: No results for input(s): VITAMINB12, FOLATE, FERRITIN, TIBC, IRON, RETICCTPCT in the last 72 hours. Sepsis Labs: Recent Labs  Lab 11/02/18 0504  11/02/18 1806 11/02/18 2130 11/03/18 1025 11/03/18 1429 11/04/18 0022  PROCALCITON 7.82  --   --   --  21.21 19.02  --   LATICACIDVEN  --    < > 2.3* 1.7 1.1  --  1.9   < > = values in this interval not displayed.    Recent Results (from the past 240 hour(s))  Urine culture     Status: Abnormal   Collection Time: 11/01/18 10:16 PM  Result Value Ref Range Status   Specimen Description URINE, RANDOM  Final   Special Requests   Final    NONE Performed at [facility], [address], [place], [place] [address]    Culture >=100,000 COLONIES/mL ESCHERICHIA COLI (A)  Final   Report Status 11/03/2018 FINAL  Final   Organism ID, Bacteria ESCHERICHIA COLI (A)  Final      Susceptibility   Escherichia coli - MIC*    AMPICILLIN 16 INTERMEDIATE Intermediate     CEFAZOLIN <=4 SENSITIVE Sensitive     CEFTRIAXONE <=1 SENSITIVE Sensitive      CIPROFLOXACIN <=0.25 SENSITIVE Sensitive     GENTAMICIN <=1 SENSITIVE Sensitive     IMIPENEM <=0.25 SENSITIVE Sensitive     NITROFURANTOIN <=16 SENSITIVE Sensitive     TRIMETH/SULFA <=20 SENSITIVE Sensitive     AMPICILLIN/SULBACTAM 4 SENSITIVE Sensitive     PIP/TAZO <=4 SENSITIVE Sensitive     Extended ESBL NEGATIVE Sensitive     * >=100,000 COLONIES/mL ESCHERICHIA COLI  Culture, blood (routine x 2)     Status: None (Preliminary result)   Collection Time: 11/02/18  5:54 AM  Result Value Ref Range Status   Specimen Description BLOOD RIGHT HAND  Final   Special Requests  Final    BOTTLES DRAWN AEROBIC ONLY Blood Culture adequate volume   Culture   Final    NO GROWTH 2 DAYS Performed at [facility], [address], [place], [place] [address]    Report Status PENDING  Incomplete  Culture, blood (routine x 2)     Status: None (Preliminary result)   Collection Time: 11/02/18  5:54 AM  Result Value Ref Range Status   Specimen Description BLOOD LEFT ANTECUBITAL  Final   Special Requests   Final    BOTTLES DRAWN AEROBIC ONLY Blood Culture adequate volume   Culture   Final    NO GROWTH 2 DAYS Performed at [facility], [address] [place], [place] [address]    Report Status PENDING  Incomplete         Radiology Studies: US Renal  Result Date: 11/04/2018 CLINICAL DATA:  Right hydronephrosis. EXAM: RENAL / URINARY TRACT ULTRASOUND COMPLETE COMPARISON:  CT of the abdomen and pelvis without contrast on 11/01/2018 and MRI of the abdomen on 11/02/2018. FINDINGS: Right Kidney: Renal measurements: 10.8 x 5.5 x 6.0 cm = volume: 185 mL. Persistent mild to moderate hydronephrosis. No masses. Left Kidney: Renal measurements: 11.2 x 5.3 x 5.4 cm = volume: 168 mL. Echogenicity within normal limits. No mass or hydronephrosis visualized. Bladder: Appears normal for degree of bladder distention. Left ureteral jet visualized. A right ureteral jet is not seen. IMPRESSION: 1. Persistent  mild to moderate right-sided hydronephrosis. 2. Lack of visualization of a right ureteral jet. On review of the prior CT of the pelvis, there may be a tiny calculus in the distal right ureter. It may be worth repeating a CT of the abdomen and pelvis without contrast in determining if there is truly a distal ureteral calculus and whether it has migrated. Electronically Signed   By: Irish Lack M.D.   On: 11/04/2018 09:49   Dg Chest Port 1 View  Result Date: 11/04/2018 CLINICAL DATA:  Fever. Hypoxia. EXAM: PORTABLE CHEST 1 VIEW COMPARISON:  Lung bases from abdominal CT 11/01/2018 FINDINGS: Low lung volumes. Mild bibasilar atelectasis. Possible trace pleural effusions. Heart size is normal for technique. No pulmonary edema. No pneumothorax. IMPRESSION: Low lung volumes with bibasilar atelectasis. Possible trace pleural effusions. Electronically Signed   By: Narda Rutherford M.D.   On: 11/04/2018 00:19        Scheduled Meds:  buPROPion  300 mg Oral Daily   insulin aspart  0-9 Units Subcutaneous TID WC   Continuous Infusions:  sodium chloride 250 mL (11/03/18 2013)   sodium chloride 75 mL/hr at 11/04/18 0831   piperacillin-tazobactam (ZOSYN)  IV 3.375 g (11/04/18 1208)     LOS: 2 days    Time spent: 35 minutes.     Alba Cory, MD [facility] Pager [phone]  If 7PM-7AM, please contact night-coverage [email or web address] Password [facility] 11/04/2018, 2:35 PM

## 2018-11-04 NOTE — Progress Notes (Signed)
\  Pharmacy Antibiotic Note  Emily Benton is a 73 y.o. female admitted on 11/01/2018 with UTI.  Pharmacy has been consulted for Zosyn dosing.  ID: s/p Rocephin for possible pyelo. Urine cx growing GNRs. MD wanting to broaden to Zosyn.  - Tmax 102.9 at midnight with tachy and chills, currently afebrile. WBC 18.5>7.3. Scr 1.33 down.  Ceftriaxone 4/5 >> 4/7 Zosyn 4/7 >>  4/6 BC x 2>> 4/5 Ecoli: Intermed to amp, sens to others  Plan: Con't zosyn 3.375g IV q 8hrs (dose ok for renal function) Pharmacy will sign off. Please reconsult for further dosing assitance.   Height: 4\' 11"  (149.9 cm) Weight: 145 lb 11.6 oz (66.1 kg) IBW/kg (Calculated) : 43.2  Temp (24hrs), Avg:100.1 F (37.8 C), Min:98.7 F (37.1 C), Max:102.9 F (39.4 C)  Recent Labs  Lab 11/01/18 2101 11/02/18 0504  11/02/18 1428 11/02/18 1806 11/02/18 2130 11/03/18 0642 11/03/18 1025 11/03/18 1429 11/04/18 0022  WBC 18.2* 17.2*  --  9.0  --   --  18.5*  --  17.2* 7.3  CREATININE 1.43* 1.29*  --   --   --   --  1.41*  --   --  1.33*  LATICACIDVEN  --   --    < > 1.6 2.3* 1.7  --  1.1  --  1.9   < > = values in this interval not displayed.    Estimated Creatinine Clearance: 31.6 mL/min (A) (by C-G formula based on SCr of 1.33 mg/dL (H)).    Allergies  Allergen Reactions  . Codeine Nausea And Vomiting    Emily Benton S. Emily Benton, PharmD, BCPS Clinical Staff Pharmacist Emily Benton [facility] 11/04/2018 9:28 AM

## 2018-11-05 LAB — CBC
HCT: 32 % — ABNORMAL LOW (ref 36.0–46.0)
Hemoglobin: 10.4 g/dL — ABNORMAL LOW (ref 12.0–15.0)
MCH: 26.3 pg (ref 26.0–34.0)
MCHC: 32.5 g/dL (ref 30.0–36.0)
MCV: 80.8 fL (ref 80.0–100.0)
Platelets: 177 10*3/uL (ref 150–400)
RBC: 3.96 MIL/uL (ref 3.87–5.11)
RDW: 15.7 % — ABNORMAL HIGH (ref 11.5–15.5)
WBC: 10.5 10*3/uL (ref 4.0–10.5)
nRBC: 0 % (ref 0.0–0.2)

## 2018-11-05 LAB — BASIC METABOLIC PANEL
Anion gap: 10 (ref 5–15)
BUN: 14 mg/dL (ref 8–23)
CO2: 21 mmol/L — ABNORMAL LOW (ref 22–32)
Calcium: 8.5 mg/dL — ABNORMAL LOW (ref 8.9–10.3)
Chloride: 113 mmol/L — ABNORMAL HIGH (ref 98–111)
Creatinine, Ser: 1.15 mg/dL — ABNORMAL HIGH (ref 0.44–1.00)
GFR calc Af Amer: 55 mL/min — ABNORMAL LOW (ref >60)
GFR calc non Af Amer: 47 mL/min — ABNORMAL LOW (ref >60)
Glucose, Bld: 134 mg/dL — ABNORMAL HIGH (ref 70–99)
Potassium: 3.3 mmol/L — ABNORMAL LOW (ref 3.5–5.1)
Sodium: 144 mmol/L (ref 135–145)

## 2018-11-05 LAB — GLUCOSE, CAPILLARY
Glucose-Capillary: 115 mg/dL — ABNORMAL HIGH (ref 70–99)
Glucose-Capillary: 140 mg/dL — ABNORMAL HIGH (ref 70–99)
Glucose-Capillary: 82 mg/dL (ref 70–99)

## 2018-11-05 MED ORDER — SODIUM CHLORIDE 0.9 % IV SOLN
1.0000 g | INTRAVENOUS | Status: DC
  Administered 2018-11-05 – 2018-11-06 (×2): 1 g via INTRAVENOUS
  Filled 2018-11-05 (×4): qty 10

## 2018-11-05 MED ORDER — POTASSIUM CHLORIDE CRYS ER 20 MEQ PO TBCR
40.0000 meq | EXTENDED_RELEASE_TABLET | Freq: Once | ORAL | Status: AC
  Administered 2018-11-05: 40 meq via ORAL
  Filled 2018-11-05: qty 2

## 2018-11-05 MED ORDER — POTASSIUM CHLORIDE CRYS ER 20 MEQ PO TBCR
20.0000 meq | EXTENDED_RELEASE_TABLET | Freq: Once | ORAL | Status: AC
  Administered 2018-11-05: 20 meq via ORAL
  Filled 2018-11-05: qty 1

## 2018-11-05 NOTE — Care Management Important Message (Signed)
Important Message  Patient Details  Name: Emily Benton MRN: [id number] Date of Birth: Dec 14, 1945   Medicare Important Message Given:  Yes    [name] [name] 11/05/2018, 3:56 PM

## 2018-11-05 NOTE — Progress Notes (Signed)
PROGRESS NOTE    Aviya Tonn  MHW:[id number] DOB: [birth year] DOA: 11/01/2018 PCP: Renford Dills, MD    Brief Narrative: 73 year old with past medical history significant for hypertension, depression who presented emergency department complaining of right-sided flank pain, nausea and vomiting for 2 days prior to admission.  Patient was found to have UTI and Sirs criteria.  CT scan noted right side hydronephrosis with possible passed a stone or ascending infection.  There was also question of ductal stone which was ruled out by MRCP.  Assessment & Plan:   Principal Problem:   SIRS (systemic inflammatory response syndrome) (HCC) Active Problems:   Pyelonephritis of right kidney   ARF (acute renal failure) (HCC)   Sepsis (HCC)   New onset type 2 diabetes mellitus (HCC)   1-sepsis secondary to pyelonephritis the right kidney.  Right side hydronephrosis Continue with IV fluids, IV antibiotics.  Urine culture growing E. Coli. Patient was changed to IV Zosyn due to increasing procalcitonin level and white count. She spike a fever on 4/7.  Urology consulted to evaluate for possible obstructive uropathy Ultrasound showed possible UB J obstruction. 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 Needs oral antibiotics for 10--14 days and follow up with urology for further evaluation of UPJ.   DM : Continue with a sliding scale insulin. Hemoglobin A1c at 6.6.  AKI: Related to infection, improved continue with IV fluids.    Hypokalemia; replete orally  Estimated body mass index is 29.52 kg/m as calculated from the following:   Height as of this encounter: 4\' 11"  (1.499 m).   Weight as of this encounter: 66.3 kg.   DVT prophylaxis: SCD Code Status: Full code Family Communication: husband didn't answer phone Disposition Plan: remain in the hospital for IV fluids.   Consultants:   Urology    Procedures:   Renal US;     Antimicrobials:  Zosyn    Subjective: Flank pain is better.  Feeling better.    Objective: Vitals:   11/04/18 1702 11/04/18 2051 11/05/18 0458 11/05/18 0934  BP: 132/89 (!) 143/81 111/77 132/83  Pulse: (!) 116 (!) 123 (!) 106 (!) 107  Resp: 20 20 18 20   Temp: 98.3 F (36.8 C) 98 F (36.7 C) 98.1 F (36.7 C) 98 F (36.7 C)  TempSrc: Oral Oral Oral Oral  SpO2: 93% 93% 93% 94%  Weight:  66.3 kg    Height:        Intake/Output Summary (Last 24 hours) at 11/05/2018 0935 Last data filed at 11/05/2018 0900 Gross per 24 hour  Intake 2292.5 ml  Output 1000 ml  Net 1292.5 ml   Filed Weights   11/02/18 2030 11/03/18 2113 11/04/18 2051  Weight: 66.1 kg 66.1 kg 66.3 kg    Examination:  General exam: NAD Respiratory system: CTA Cardiovascular system: S 1, S 2 RRR. Gastrointestinal system: BS present, soft, nt Central nervous system: non focal.  Extremities: symmetric power.  Skin: no rashes.   Data Reviewed: I have personally reviewed following labs and imaging studies  CBC: Recent Labs  Lab 11/01/18 2101 11/02/18 0504 11/02/18 1428 11/03/18 0642 11/03/18 1429 11/04/18 0022 11/05/18 0632  WBC 18.2* 17.2* 9.0 18.5* 17.2* 7.3 10.5  NEUTROABS 16.5* 15.1*  --   --   --   --   --   HGB 14.0 11.9* 11.2* 10.8* 10.9* 11.0* 10.4*  HCT 43.6 37.0 35.7* 33.2* 32.8* 33.9* 32.0*  MCV 82.7 83.0 82.6 82.2 81.8 80.7 80.8  PLT 303 249 227 187 156 167 177   Basic Metabolic Panel: Recent Labs  Lab 11/01/18 2101 11/02/18 0504 11/03/18 0642 11/04/18 0022 11/05/18 0632  NA 138 139 142 138 144  K 3.4* 3.0* 3.4* 3.3* 3.3*  CL 100 106 111 106 113*  CO2 25 21* 20* 19* 21*  GLUCOSE 178* 138* 131* 146* 134*  BUN 19 15 18 20 14   CREATININE 1.43* 1.29* 1.41* 1.33* 1.15*  CALCIUM 9.2 8.4* 8.2* 8.2* 8.5*   GFR: Estimated Creatinine Clearance: 36.6 mL/min (A) (by C-G formula based on SCr of 1.15 mg/dL (H)). Liver Function Tests: Recent Labs  Lab 11/01/18 2101  11/02/18 0504  AST 24 20  ALT 21 17  ALKPHOS 75 58  BILITOT 0.9 0.6  PROT 6.8 5.3*  ALBUMIN 4.0 3.0*   Recent Labs  Lab 11/01/18 2101  LIPASE 20   No results for input(s): AMMONIA in the last 168 hours. Coagulation Profile: No results for input(s): INR, PROTIME in the last 168 hours. Cardiac Enzymes: No results for input(s): CKTOTAL, CKMB, CKMBINDEX, TROPONINI in the last 168 hours. BNP (last 3 results) No results for input(s): PROBNP in the last 8760 hours. HbA1C: No results for input(s): HGBA1C in the last 72 hours. CBG: Recent Labs  Lab 11/04/18 0730 11/04/18 1110 11/04/18 1703 11/04/18 2052 11/05/18 0642  GLUCAP 159* 111* 151* 138* 115*   Lipid Profile: No results for input(s): CHOL, HDL, LDLCALC, TRIG, CHOLHDL, LDLDIRECT in the last 72 hours. Thyroid Function Tests: No results for input(s): TSH, T4TOTAL, FREET4, T3FREE, THYROIDAB in the last 72 hours. Anemia Panel: No results for input(s): VITAMINB12, FOLATE, FERRITIN, TIBC, IRON, RETICCTPCT in the last 72 hours. Sepsis Labs: Recent Labs  Lab 11/02/18 0504  11/02/18 1806 11/02/18 2130 11/03/18 1025 11/03/18 1429 11/04/18 0022  PROCALCITON 7.82  --   --   --  21.21 19.02  --   LATICACIDVEN  --    < > 2.3* 1.7 1.1  --  1.9   < > = values in this interval not displayed.    Recent Results (from the past 240 hour(s))  Urine culture     Status: Abnormal   Collection Time: 11/01/18 10:16 PM  Result Value Ref Range Status   Specimen Description URINE, RANDOM  Final   Special Requests   Final    NONE Performed at [facility], [address] [place], [place] [address]    Culture >=100,000 COLONIES/mL ESCHERICHIA COLI (A)  Final   Report Status 11/03/2018 FINAL  Final   Organism ID, Bacteria ESCHERICHIA COLI (A)  Final      Susceptibility   Escherichia coli - MIC*    AMPICILLIN 16 INTERMEDIATE Intermediate     CEFAZOLIN <=4 SENSITIVE Sensitive     CEFTRIAXONE <=1 SENSITIVE Sensitive      CIPROFLOXACIN <=0.25 SENSITIVE Sensitive     GENTAMICIN <=1 SENSITIVE Sensitive     IMIPENEM <=0.25 SENSITIVE Sensitive     NITROFURANTOIN <=16 SENSITIVE Sensitive     TRIMETH/SULFA <=20 SENSITIVE Sensitive     AMPICILLIN/SULBACTAM 4 SENSITIVE Sensitive     PIP/TAZO <=4 SENSITIVE Sensitive     Extended ESBL NEGATIVE Sensitive     * >=100,000 COLONIES/mL ESCHERICHIA COLI  Culture, blood (routine x 2)     Status: None (Preliminary result)   Collection Time: 11/02/18  5:54 AM  Result Value Ref Range Status   Specimen Description BLOOD RIGHT HAND  Final   Special Requests   Final  BOTTLES DRAWN AEROBIC ONLY Blood Culture adequate volume   Culture   Final    NO GROWTH 3 DAYS Performed at [facility], [address], [place], [place] [address]    Report Status PENDING  Incomplete  Culture, blood (routine x 2)     Status: None (Preliminary result)   Collection Time: 11/02/18  5:54 AM  Result Value Ref Range Status   Specimen Description BLOOD LEFT ANTECUBITAL  Final   Special Requests   Final    BOTTLES DRAWN AEROBIC ONLY Blood Culture adequate volume   Culture   Final    NO GROWTH 3 DAYS Performed at [facility], [address], [place], [place] [address]    Report Status PENDING  Incomplete         Radiology Studies: US Renal  Result Date: 11/04/2018 CLINICAL DATA:  Right hydronephrosis. EXAM: RENAL / URINARY TRACT ULTRASOUND COMPLETE COMPARISON:  CT of the abdomen and pelvis without contrast on 11/01/2018 and MRI of the abdomen on 11/02/2018. FINDINGS: Right Kidney: Renal measurements: 10.8 x 5.5 x 6.0 cm = volume: 185 mL. Persistent mild to moderate hydronephrosis. No masses. Left Kidney: Renal measurements: 11.2 x 5.3 x 5.4 cm = volume: 168 mL. Echogenicity within normal limits. No mass or hydronephrosis visualized. Bladder: Appears normal for degree of bladder distention. Left ureteral jet visualized. A right ureteral jet is not seen. IMPRESSION: 1. Persistent  mild to moderate right-sided hydronephrosis. 2. Lack of visualization of a right ureteral jet. On review of the prior CT of the pelvis, there may be a tiny calculus in the distal right ureter. It may be worth repeating a CT of the abdomen and pelvis without contrast in determining if there is truly a distal ureteral calculus and whether it has migrated. Electronically Signed   By: Irish Lack M.D.   On: 11/04/2018 09:49   Dg Chest Port 1 View  Result Date: 11/04/2018 CLINICAL DATA:  Fever. Hypoxia. EXAM: PORTABLE CHEST 1 VIEW COMPARISON:  Lung bases from abdominal CT 11/01/2018 FINDINGS: Low lung volumes. Mild bibasilar atelectasis. Possible trace pleural effusions. Heart size is normal for technique. No pulmonary edema. No pneumothorax. IMPRESSION: Low lung volumes with bibasilar atelectasis. Possible trace pleural effusions. Electronically Signed   By: Narda Rutherford M.D.   On: 11/04/2018 00:19        Scheduled Meds:  buPROPion  300 mg Oral Daily   insulin aspart  0-9 Units Subcutaneous TID WC   potassium chloride  40 mEq Oral Once   Continuous Infusions:  sodium chloride 250 mL (11/03/18 2013)   sodium chloride 100 mL/hr at 11/04/18 1446   cefTRIAXone (ROCEPHIN)  IV       LOS: 3 days    Time spent: 35 minutes.     Alba Cory, MD [facility] Pager [phone]  If 7PM-7AM, please contact night-coverage [email or web address] Password [email or web address] 11/05/2018, 9:35 AM

## 2018-11-05 NOTE — Progress Notes (Signed)
Patient ID: Emily Benton, female   DOB: 1946-02-27, 73 y.o.   MRN: [id number]    Subjective: Pt feeling better.  Her flank pain symptoms are improved.  No fever overnight.  Objective: Vital signs in last 24 hours: Temp:  [98 F (36.7 C)-98.7 F (37.1 C)] 98.1 F (36.7 C) (04/09 0458) Pulse Rate:  [106-123] 106 (04/09 0458) Resp:  [18-20] 18 (04/09 0458) BP: (111-143)/(77-89) 111/77 (04/09 0458) SpO2:  [93 %-96 %] 93 % (04/09 0458) Weight:  [66.3 kg] 66.3 kg (04/08 2051)  Intake/Output from previous day: 04/08 0701 - 04/09 0700 In: 2412.5 [P.O.:500; I.V.:1712.5; IV Piggyback:200] Out: 1250 [Urine:1250] Intake/Output this shift: Total I/O In: 1382.5 [P.O.:120; I.V.:1162.5; IV Piggyback:100] Out: 700 [Urine:700]  Physical Exam:  General: Alert and oriented Abdomen: Soft, ND, minimal right CVAT   Lab Results: Recent Labs    11/03/18 0642 11/03/18 1429 11/04/18 0022  HGB 10.8* 10.9* 11.0*  HCT 33.2* 32.8* 33.9*   CBC Latest Ref Rng & Units 11/04/2018 11/03/2018 11/03/2018  WBC 4.0 - 10.5 K/uL 7.3 17.2(H) 18.5(H)  Hemoglobin 12.0 - 15.0 g/dL 11.0(L) 10.9(L) 10.8(L)  Hematocrit 36.0 - 46.0 % 33.9(L) 32.8(L) 33.2(L)  Platelets 150 - 400 K/uL 167 156 187     BMET Recent Labs    11/03/18 0642 11/04/18 0022  NA 142 138  K 3.4* 3.3*  CL 111 106  CO2 20* 19*  GLUCOSE 131* 146*  BUN 18 20  CREATININE 1.41* 1.33*  CALCIUM 8.2* 8.2*     Studies/Results:   Assessment/Plan: Right hydronephrosis/pyelonephritis: No fever over last 24 hrs.  In absence of clear source of obstruction, no indication for urologic intervention at this time.  Ok to allow her to resume her diet. 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  I would recommend completion of 10-14 days of culture specific antibiotic therapy as an outpatient.  If she develops recurrent fever, please notify us or ask  the patient to call us.  Otherwise, she should follow up with me as an outpatient in about 4-6 weeks to document resolution of infection and to discuss further evaluation of right hydronephrosis. Will sign off for now.   LOS: 3 days   Crecencio Mc 11/05/2018, 6:57 AM

## 2018-11-06 LAB — BASIC METABOLIC PANEL
Anion gap: 8 (ref 5–15)
BUN: 10 mg/dL (ref 8–23)
CO2: 24 mmol/L (ref 22–32)
Calcium: 8.8 mg/dL — ABNORMAL LOW (ref 8.9–10.3)
Chloride: 115 mmol/L — ABNORMAL HIGH (ref 98–111)
Creatinine, Ser: 0.94 mg/dL (ref 0.44–1.00)
GFR calc Af Amer: >60 mL/min (ref >60)
GFR calc non Af Amer: >60 mL/min (ref >60)
Glucose, Bld: 101 mg/dL — ABNORMAL HIGH (ref 70–99)
Potassium: 3.5 mmol/L (ref 3.5–5.1)
Sodium: 147 mmol/L — ABNORMAL HIGH (ref 135–145)

## 2018-11-06 LAB — CBC
HCT: 31.3 % — ABNORMAL LOW (ref 36.0–46.0)
Hemoglobin: 10.3 g/dL — ABNORMAL LOW (ref 12.0–15.0)
MCH: 26.8 pg (ref 26.0–34.0)
MCHC: 32.9 g/dL (ref 30.0–36.0)
MCV: 81.5 fL (ref 80.0–100.0)
Platelets: 208 10*3/uL (ref 150–400)
RBC: 3.84 MIL/uL — ABNORMAL LOW (ref 3.87–5.11)
RDW: 16 % — ABNORMAL HIGH (ref 11.5–15.5)
WBC: 10.8 10*3/uL — ABNORMAL HIGH (ref 4.0–10.5)
nRBC: 0.3 % — ABNORMAL HIGH (ref 0.0–0.2)

## 2018-11-06 LAB — GLUCOSE, CAPILLARY
Glucose-Capillary: 80 mg/dL (ref 70–99)
Glucose-Capillary: 92 mg/dL (ref 70–99)
Glucose-Capillary: 75 mg/dL (ref 70–99)
Glucose-Capillary: 86 mg/dL (ref 70–99)

## 2018-11-06 MED ORDER — SACCHAROMYCES BOULARDII 250 MG PO CAPS
250.0000 mg | ORAL_CAPSULE | Freq: Two times a day (BID) | ORAL | Status: DC
  Administered 2018-11-06 – 2018-11-07 (×3): 250 mg via ORAL
  Filled 2018-11-06 (×3): qty 1

## 2018-11-06 MED ORDER — SODIUM CHLORIDE 0.45 % IV SOLN
INTRAVENOUS | Status: DC
  Administered 2018-11-06 – 2018-11-07 (×2): via INTRAVENOUS

## 2018-11-06 MED ORDER — AMLODIPINE BESYLATE 5 MG PO TABS
5.0000 mg | ORAL_TABLET | Freq: Every day | ORAL | Status: DC
  Administered 2018-11-06 – 2018-11-07 (×2): 5 mg via ORAL
  Filled 2018-11-06 (×2): qty 1

## 2018-11-06 MED ORDER — POTASSIUM CHLORIDE CRYS ER 20 MEQ PO TBCR
40.0000 meq | EXTENDED_RELEASE_TABLET | Freq: Once | ORAL | Status: AC
  Administered 2018-11-06: 40 meq via ORAL
  Filled 2018-11-06: qty 2

## 2018-11-06 NOTE — Progress Notes (Signed)
PROGRESS NOTE    Emily Benton  UJW:[id number] DOB: 03-30-1946 DOA: 11/01/2018 PCP: Renford Dills, MD    Brief Narrative: 73 year old with past medical history significant for hypertension, depression who presented emergency department complaining of right-sided flank pain, nausea and vomiting for 2 days prior to admission.  Patient was found to have UTI and Sirs criteria.  CT scan noted right side hydronephrosis with possible passed a stone or ascending infection.  There was also question of ductal stone which was ruled out by MRCP.  Assessment & Plan:   Principal Problem:   SIRS (systemic inflammatory response syndrome) (HCC) Active Problems:   Pyelonephritis of right kidney   ARF (acute renal failure) (HCC)   Sepsis (HCC)   New onset type 2 diabetes mellitus (HCC)   1-Sepsis secondary to pyelonephritis the right kidney.  Right side hydronephrosis Continue with IV fluids, IV antibiotics.  Urine culture growing E. Coli. Patient was changed to IV Zosyn due to increasing procalcitonin level and white count. She spike a fever on 4/7.  Urology consulted to evaluate for possible obstructive uropathy Ultrasound showed possible UB J obstruction. 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 Needs oral antibiotics for 10--14 days and follow up with urology for further evaluation of UPJ.   Diarrhea;  Had multiples episodes yesterday.  2 BM today so far. If she develops more than 3 watery stool today will check for C diff.   DM : Continue with a sliding scale insulin. Hemoglobin A1c at 6.6.  AKI: Related to infection, improved continue with IV fluids.    Hypokalemia; replete orally HTN; start Norvasc.   Hypernatremia; change IV fluids to half NS>   Estimated body mass index is 29.52 kg/m as calculated from the following:   Height as of this encounter:  (1.499 m).   Weight as of this encounter: 66.3 kg.   DVT  prophylaxis: SCD Code Status: Full code Family Communication: husband didn't answer phone Disposition Plan: remain in the hospital for IV fluids.   Consultants:   Urology    Procedures:   Renal US;    Antimicrobials:  Zosyn    Subjective: Complaining of diarrhea.    Objective: Vitals:   11/05/18 1700 11/05/18 2113 11/06/18 0638 11/06/18 0843  BP: (!) 143/89 136/86 (!) 151/89 (!) 145/120  Pulse: (!) 101 99 96 92  Resp: Temp: 98 F (36.7 C) 98.2 F (36.8 C) 98.1 F (36.7 C) 98.1 F (36.7 C)  TempSrc: Oral Oral Oral Oral  SpO2: 95% 97% 96% 97%  Weight:      Height:        Intake/Output Summary (Last 24 hours) at 11/06/2018 1145 Last data filed at 11/06/2018 0900 Gross per 24 hour  Intake 2802.58 ml  Output 0 ml  Net 2802.58 ml   Filed Weights   11/02/18 2030 11/03/18 2113 11/04/18 2051  Weight: 66.1 kg 66.1 kg 66.3 kg    Examination:  General exam: NAD Respiratory system: CTA Cardiovascular system: S 1, S 2 RRR Gastrointestinal system: BS present, soft, nt Central nervous system: Non focal.  Extremities: Symmetric power.  Skin: No rashes  Data Reviewed: I have personally reviewed following labs and imaging studies  CBC: Recent Labs  Lab 11/01/18 2101 11/02/18 0504  11/03/18 0642 11/03/18 1429 11/04/18 0022 11/05/18 0632 11/06/18 1017  WBC 18.2* 17.2*   < > 18.5* 17.2* 7.3 10.5 10.8*  NEUTROABS 16.5* 15.1*  --   --   --   --   --   --  HGB 14.0 11.9*   < > 10.8* 10.9* 11.0* 10.4* 10.3*  HCT 43.6 37.0   < > 33.2* 32.8* 33.9* 32.0* 31.3*  MCV 82.7 83.0   < > 82.2 81.8 80.7 80.8 81.5  PLT 303 249   < > 187 156 167 177 208   < > = values in this interval not displayed.   Basic Metabolic Panel: Recent Labs  Lab 11/02/18 0504 11/03/18 0642 11/04/18 0022 11/05/18 0632 11/06/18 0749  NA 139 142 138 144 147*  K 3.0* 3.4* 3.3* 3.3* 3.5  CL 106 111 106 113* 115*  CO2 21* 20* 19* 21* 24  GLUCOSE 138* 131* 146* 134* 101*  BUN  15 18 20 14 10   CREATININE 1.29* 1.41* 1.33* 1.15* 0.94  CALCIUM 8.4* 8.2* 8.2* 8.5* 8.8*   GFR: Estimated Creatinine Clearance: 44.8 mL/min (by C-G formula based on SCr of 0.94 mg/dL). Liver Function Tests: Recent Labs  Lab 11/01/18 2101 11/02/18 0504  AST 24 20  ALT 21 17  ALKPHOS 75 58  BILITOT 0.9 0.6  PROT 6.8 5.3*  ALBUMIN 4.0 3.0*   Recent Labs  Lab 11/01/18 2101  LIPASE 20   No results for input(s): AMMONIA in the last 168 hours. Coagulation Profile: No results for input(s): INR, PROTIME in the last 168 hours. Cardiac Enzymes: No results for input(s): CKTOTAL, CKMB, CKMBINDEX, TROPONINI in the last 168 hours. BNP (last 3 results) No results for input(s): PROBNP in the last 8760 hours. HbA1C: No results for input(s): HGBA1C in the last 72 hours. CBG: Recent Labs  Lab 11/05/18 0642 11/05/18 1129 11/05/18 1658 11/06/18 0637 11/06/18 1117  GLUCAP 115* 140* 82 80 92   Lipid Profile: No results for input(s): CHOL, HDL, LDLCALC, TRIG, CHOLHDL, LDLDIRECT in the last 72 hours. Thyroid Function Tests: No results for input(s): TSH, T4TOTAL, FREET4, T3FREE, THYROIDAB in the last 72 hours. Anemia Panel: No results for input(s): VITAMINB12, FOLATE, FERRITIN, TIBC, IRON, RETICCTPCT in the last 72 hours. Sepsis Labs: Recent Labs  Lab 11/02/18 0504  11/02/18 1806 11/02/18 2130 11/03/18 1025 11/03/18 1429 11/04/18 0022  PROCALCITON 7.82  --   --   --  21.21 19.02  --   LATICACIDVEN  --    < > 2.3* 1.7 1.1  --  1.9   < > = values in this interval not displayed.    Recent Results (from the past 240 hour(s))  Urine culture     Status: Abnormal   Collection Time: 11/01/18 10:16 PM  Result Value Ref Range Status   Specimen Description URINE, RANDOM  Final   Special Requests   Final    NONE Performed at [facility], [address] [place], [place] [address]    Culture >=100,000 COLONIES/mL ESCHERICHIA COLI (A)  Final   Report Status 11/03/2018 FINAL   Final   Organism ID, Bacteria ESCHERICHIA COLI (A)  Final      Susceptibility   Escherichia coli - MIC*    AMPICILLIN 16 INTERMEDIATE Intermediate     CEFAZOLIN <=4 SENSITIVE Sensitive     CEFTRIAXONE <=1 SENSITIVE Sensitive     CIPROFLOXACIN <=0.25 SENSITIVE Sensitive     GENTAMICIN <=1 SENSITIVE Sensitive     IMIPENEM <=0.25 SENSITIVE Sensitive     NITROFURANTOIN <=16 SENSITIVE Sensitive     TRIMETH/SULFA <=20 SENSITIVE Sensitive     AMPICILLIN/SULBACTAM 4 SENSITIVE Sensitive     PIP/TAZO <=4 SENSITIVE Sensitive     Extended ESBL NEGATIVE Sensitive     * >=  100,000 COLONIES/mL ESCHERICHIA COLI  Culture, blood (routine x 2)     Status: None (Preliminary result)   Collection Time: 11/02/18  5:54 AM  Result Value Ref Range Status   Specimen Description BLOOD RIGHT HAND  Final   Special Requests   Final    BOTTLES DRAWN AEROBIC ONLY Blood Culture adequate volume   Culture   Final    NO GROWTH 3 DAYS Performed at [facility], [address] [place], [place] [address]    Report Status PENDING  Incomplete  Culture, blood (routine x 2)     Status: None (Preliminary result)   Collection Time: 11/02/18  5:54 AM  Result Value Ref Range Status   Specimen Description BLOOD LEFT ANTECUBITAL  Final   Special Requests   Final    BOTTLES DRAWN AEROBIC ONLY Blood Culture adequate volume   Culture   Final    NO GROWTH 3 DAYS Performed at [facility], [address] [place], [place] [address]    Report Status PENDING  Incomplete         Radiology Studies: No results found.      Scheduled Meds: . amLODipine  5 mg Oral Daily  . buPROPion  300 mg Oral Daily  . insulin aspart  0-9 Units Subcutaneous TID WC  . saccharomyces boulardii  250 mg Oral BID   Continuous Infusions: . sodium chloride    . sodium chloride 250 mL (11/03/18 2013)  . cefTRIAXone (ROCEPHIN)  IV 1 g (11/05/18 1055)     LOS: 4 days    Time spent: 35 minutes.     Alba CoryBelkys A Jamara Vary, MD  [facility] Pager [phone]  If 7PM-7AM, please contact night-coverage [email or web address] Password [email or web address] 11/06/2018, 11:45 AM

## 2018-11-06 NOTE — Progress Notes (Signed)
PIV consult: Arrived to room, Mathis Fare, RN stated "[place].

## 2018-11-07 LAB — CULTURE, BLOOD (ROUTINE X 2)
Culture: NO GROWTH
Culture: NO GROWTH
Special Requests: ADEQUATE
Special Requests: ADEQUATE

## 2018-11-07 LAB — GLUCOSE, CAPILLARY
Glucose-Capillary: 83 mg/dL (ref 70–99)
Glucose-Capillary: 87 mg/dL (ref 70–99)

## 2018-11-07 MED ORDER — SACCHAROMYCES BOULARDII 250 MG PO CAPS: 250.0000 mg | ORAL_CAPSULE | Freq: Two times a day (BID) | ORAL | 0 refills | Status: AC

## 2018-11-07 MED ORDER — CEPHALEXIN 500 MG PO CAPS
500.0000 mg | ORAL_CAPSULE | Freq: Two times a day (BID) | ORAL | Status: DC
  Administered 2018-11-07: 500 mg via ORAL
  Filled 2018-11-07: qty 1

## 2018-11-07 MED ORDER — CEPHALEXIN 500 MG PO CAPS: 500.0000 mg | ORAL_CAPSULE | Freq: Two times a day (BID) | ORAL | 0 refills | Status: AC

## 2018-11-07 MED ORDER — BLOOD GLUCOSE METER KIT: PACK | 0 refills | Status: AC

## 2018-11-07 MED ORDER — AMLODIPINE BESYLATE 5 MG PO TABS: 5.0000 mg | ORAL_TABLET | Freq: Every day | ORAL | 0 refills | Status: AC

## 2018-11-07 NOTE — Discharge Summary (Signed)
Physician Discharge Summary  Emily Benton RSW:[id number] DOB: 29-Jul-1946 DOA: 11/01/2018  PCP: Emily Carol, MD  Admit date: 11/01/2018 Discharge date: 11/07/2018  Admitted From: Home  Disposition:  Home   Recommendations for Outpatient Follow-up:  1. Follow up with PCP in 1-2 weeks 2. Please obtain BMP/CBC in one week 3. 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 Follow up with PCP for resolution of pyelonephritis.    Discharge Condition: stable.  CODE STATUS: full code Diet recommendation: Heart Healthy   Brief/Interim Summary:  1-Sepsis secondary to pyelonephritis the right kidney.  Right side hydronephrosis Continue with IV fluids, IV antibiotics.  Urine culture growing E. Coli. Patient was changed to IV Zosyn due to increasing procalcitonin level and white count. She spike a fever on 4/7.  Urology consulted to evaluate for possible obstructive uropathy Ultrasound showed possible UB J obstruction. 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 Needs oral antibiotics for 10--14 days and follow up with urology for further evaluation of UPJ.   Diarrhea;  Had multiples episodes yesterday.  Resolved.  Started on florastore.   DM : Continue with a sliding scale insulin. Hemoglobin A1c at 6.6.  AKI: Related to infection, improved continue with IV fluids.    Hypokalemia; replaced.   HTN; started on  Norvasc. Holding HCTZ at discharge due to AKI and electrolytes abnormalities on admission.   Hypernatremia; change IV fluids to half NS>  encourage oral intake.   Discharge Diagnoses:  Principal Problem:   SIRS (systemic inflammatory response syndrome) (HCC) Active Problems:   Pyelonephritis of right kidney   ARF (acute renal failure) (HCC)   Sepsis ([place])   New onset type 2 diabetes mellitus ([place])    Discharge Instructions  Discharge Instructions    Diet - low sodium heart healthy    Complete by:  As directed    Increase activity slowly   Complete by:  As directed      Allergies as of 11/07/2018      Reactions   Codeine Nausea And Vomiting      Medication List    STOP taking these medications   clindamycin 300 MG capsule Commonly known as:  CLEOCIN   hydrochlorothiazide 25 MG tablet Commonly known as:  HYDRODIURIL   oxyCODONE-acetaminophen 5-325 MG tablet Commonly known as:  PERCOCET/ROXICET     TAKE these medications   acetaminophen 500 MG tablet Commonly known as:  TYLENOL Take 500-1,000 mg by mouth every 6 (six) hours as needed for mild pain or headache.   amLODipine 5 MG tablet Commonly known as:  NORVASC Take 1 tablet (5 mg total) by mouth daily.   blood glucose meter kit and supplies Dispense based on patient and insurance preference. Use up to four times daily as directed. (FOR ICD-10 E10.9, E11.9).   buPROPion 300 MG 24 hr tablet Commonly known as:  WELLBUTRIN XL Take 300 mg by mouth daily.   cephALEXin 500 MG capsule Commonly known as:  KEFLEX Take 1 capsule (500 mg total) by mouth every 12 (twelve) hours for 5 days.   GOODY HEADACHE PO Take 1 packet by mouth as needed (for headaches or pain).   LORazepam 0.5 MG tablet Commonly known as:  ATIVAN Take 0.5 mg by mouth daily as needed for anxiety.   saccharomyces boulardii 250 MG capsule Commonly known as:  FLORASTOR Take 1 capsule (250 mg total) by mouth 2 (two) times daily.  Follow-up Information    Emily Carol, MD Follow up in 1 week(s).   Specialty:  Internal Medicine Contact information: [address] [place] [address] [phone]        Emily Bring, MD Follow up in 2 week(s).   Specialty:  Urology Contact information: [address] [place] [address] [phone]          Allergies  Allergen Reactions  . Codeine Nausea And Vomiting    Consultations:  urology   Procedures/Studies: US Renal  Result Date:  11/05/18 CLINICAL DATA:  Right hydronephrosis. EXAM: RENAL / URINARY TRACT ULTRASOUND COMPLETE COMPARISON:  CT of the abdomen and pelvis without contrast on 11/01/2018 and MRI of the abdomen on 11/02/2018. FINDINGS: Right Kidney: Renal measurements: 10.8 x 5.5 x 6.0 cm = volume: 185 mL. Persistent mild to moderate hydronephrosis. No masses. Left Kidney: Renal measurements: 11.2 x 5.3 x 5.4 cm = volume: 168 mL. Echogenicity within normal limits. No mass or hydronephrosis visualized. Bladder: Appears normal for degree of bladder distention. Left ureteral jet visualized. A right ureteral jet is not seen. IMPRESSION: 1. Persistent mild to moderate right-sided hydronephrosis. 2. Lack of visualization of a right ureteral jet. On review of the prior CT of the pelvis, there may be a tiny calculus in the distal right ureter. It may be worth repeating a CT of the abdomen and pelvis without contrast in determining if there is truly a distal ureteral calculus and whether it has migrated. Electronically Signed   By: [name] M.D.   On: Nov 05, 2018 09:49   Mr Abdomen Mrcp Wo Contrast  Result Date: 11/02/2018 CLINICAL DATA:  Possible cholelithiasis and choledocholithiasis on recent CT. EXAM: MRI ABDOMEN WITHOUT CONTRAST  (INCLUDING MRCP) TECHNIQUE: Multiplanar multisequence MR imaging of the abdomen was performed. Heavily T2-weighted images of the biliary and pancreatic ducts were obtained, and three-dimensional MRCP images were rendered by post processing. COMPARISON:  CT 11/01/2018 FINDINGS: Lower chest: Basilar atelectasis bilaterally. Hepatobiliary: No focal abnormality in the liver on this study without intravenous contrast. There is no evidence for gallstones, gallbladder wall thickening, or pericholecystic fluid. No evidence for choledocholithiasis. Common bile duct measures 5 mm diameter in the head of the pancreas. Pancreas: Mild distention of the main pancreatic duct identified in head of pancreas, measuring up  to 5 mm diameter. Main pancreatic duct in the body and tail of pancreas is nondilated. Spleen:  No splenomegaly. No focal mass lesion. Adrenals/Urinary Tract: No adrenal nodule or mass. Mild to moderate right hydronephrosis is associated with right perinephric edema/fluid. This edema tracks caudally in the right retroperitoneal space down towards the pelvis (not included on this abdomen exam. Stomach/Bowel: Stomach is unremarkable. No gastric wall thickening. No evidence of outlet obstruction. Duodenum is normally positioned as is the ligament of Treitz. No small bowel or colonic dilatation within the visualized abdomen. Vascular/Lymphatic: No abdominal aortic aneurysm. No abdominal lymphadenopathy. Other:  No gross free fluid in the abdomen. Musculoskeletal: No abnormal marrow signal evident within the visualized bony anatomy. IMPRESSION: 1. No evidence for cholelithiasis or choledocholithiasis. No intra or extrahepatic biliary duct dilatation. 2. Mild to moderate right hydroureteronephrosis with prominent right perinephric edema/fluid tracking caudally towards the pelvis. Etiology for the right urinary obstruction not evident on this noncontrast MRI. Stone disease or urothelial lesion would be considerations. 3. Mild distention of the main pancreatic duct in the head of pancreas without dilatation in the body or tail. Finding is indeterminate and no mass lesion is visible  in the pancreatic head. Follow-up MRCP in 3-6 months could be used to re-evaluate. Renal function permitting, exam should be performed with intravenous contrast. Electronically Signed   By: Misty Stanley M.D.   On: 11/02/2018 09:21   Mr 3d Recon At Scanner  Result Date: 11/02/2018 CLINICAL DATA:  Possible cholelithiasis and choledocholithiasis on recent CT. EXAM: MRI ABDOMEN WITHOUT CONTRAST  (INCLUDING MRCP) TECHNIQUE: Multiplanar multisequence MR imaging of the abdomen was performed. Heavily T2-weighted images of the biliary and pancreatic  ducts were obtained, and three-dimensional MRCP images were rendered by post processing. COMPARISON:  CT 11/01/2018 FINDINGS: Lower chest: Basilar atelectasis bilaterally. Hepatobiliary: No focal abnormality in the liver on this study without intravenous contrast. There is no evidence for gallstones, gallbladder wall thickening, or pericholecystic fluid. No evidence for choledocholithiasis. Common bile duct measures 5 mm diameter in the head of the pancreas. Pancreas: Mild distention of the main pancreatic duct identified in head of pancreas, measuring up to 5 mm diameter. Main pancreatic duct in the body and tail of pancreas is nondilated. Spleen:  No splenomegaly. No focal mass lesion. Adrenals/Urinary Tract: No adrenal nodule or mass. Mild to moderate right hydronephrosis is associated with right perinephric edema/fluid. This edema tracks caudally in the right retroperitoneal space down towards the pelvis (not included on this abdomen exam. Stomach/Bowel: Stomach is unremarkable. No gastric wall thickening. No evidence of outlet obstruction. Duodenum is normally positioned as is the ligament of Treitz. No small bowel or colonic dilatation within the visualized abdomen. Vascular/Lymphatic: No abdominal aortic aneurysm. No abdominal lymphadenopathy. Other:  No gross free fluid in the abdomen. Musculoskeletal: No abnormal marrow signal evident within the visualized bony anatomy. IMPRESSION: 1. No evidence for cholelithiasis or choledocholithiasis. No intra or extrahepatic biliary duct dilatation. 2. Mild to moderate right hydroureteronephrosis with prominent right perinephric edema/fluid tracking caudally towards the pelvis. Etiology for the right urinary obstruction not evident on this noncontrast MRI. Stone disease or urothelial lesion would be considerations. 3. Mild distention of the main pancreatic duct in the head of pancreas without dilatation in the body or tail. Finding is indeterminate and no mass lesion  is visible in the pancreatic head. Follow-up MRCP in 3-6 months could be used to re-evaluate. Renal function permitting, exam should be performed with intravenous contrast. Electronically Signed   By: Misty Stanley M.D.   On: 11/02/2018 09:21   Dg Chest Port 1 View  Result Date: 11/04/2018 CLINICAL DATA:  Fever. Hypoxia. EXAM: PORTABLE CHEST 1 VIEW COMPARISON:  Lung bases from abdominal CT 11/01/2018 FINDINGS: Low lung volumes. Mild bibasilar atelectasis. Possible trace pleural effusions. Heart size is normal for technique. No pulmonary edema. No pneumothorax. IMPRESSION: Low lung volumes with bibasilar atelectasis. Possible trace pleural effusions. Electronically Signed   By: Keith Rake M.D.   On: 11/04/2018 00:19   Ct Renal Stone Study  Result Date: 11/01/2018 CLINICAL DATA:  Right-sided flank pain beginning this morning with nausea and vomiting. EXAM: CT ABDOMEN AND PELVIS WITHOUT CONTRAST TECHNIQUE: Multidetector CT imaging of the abdomen and pelvis was performed following the standard protocol without IV contrast. COMPARISON:  None. FINDINGS: Lower chest: Mild linear atelectasis right base. Hepatobiliary: Possible subtle cholelithiasis. Liver is normal. 4 mm calcification projects over the porta hepatis which may be a common duct stone (similar finding was described on the prior report of CT 06/22/2002 as images not available). Pancreas: Normal. Spleen: Normal. Adrenals/Urinary Tract: Adrenal glands are normal. Kidneys are normal size. Nonobstructing punctate stone over the lower pole left  kidney. Two small right renal stones are present with the larger measuring 3-4 mm over the lower pole. Mild right-sided hydronephrosis and mild stranding of the right perinephric fat. No definite ureteral stone identified. Bladder is normal. Stomach/Bowel: Stomach and small bowel are within normal. Appendix not visualized. Minimal diverticulosis of the colon. Vascular/Lymphatic: Very minimal calcified plaque over  the abdominal aorta. No adenopathy. Reproductive: Previous hysterectomy. Other: No free peritoneal fluid.  No free peritoneal air. Musculoskeletal: Degenerative change of the spine and hips. IMPRESSION: Mild bilateral nephrolithiasis. There is right-sided hydronephrosis. No ureteral stones visualized. Findings may be due to recent passage of a stone versus ascending infection. 4 mm calcification over the porta hepatis which may represent a ductal stone (similar finding described on report of previous CT 2003 although no images available). Possible subtle cholelithiasis. No ductal dilatation. Minimal colonic diverticulosis. Aortic Atherosclerosis (ICD10-I70.0). Electronically Signed   By: Marin Olp M.D.   On: 11/01/2018 21:40      Subjective: Feeling better, diarrhea has resolved.   Discharge Exam: Vitals:   11/07/18 0429 11/07/18 0927  BP: (!) 142/82 (!) 154/89  Pulse: 91 85  Resp: 16 18  Temp: 98.1 F (36.7 C) 98 F (36.7 C)  SpO2: 97% 97%     General: Pt is alert, awake, not in acute distress Cardiovascular: RRR, S1/S2 +, no rubs, no gallops Respiratory: CTA bilaterally, no wheezing, no rhonchi Abdominal: Soft, NT, ND, bowel sounds + Extremities: no edema, no cyanosis    The results of significant diagnostics from this hospitalization (including imaging, microbiology, ancillary and laboratory) are listed below for reference.     Microbiology: Recent Results (from the past 240 hour(s))  Urine culture     Status: Abnormal   Collection Time: 11/01/18 10:16 PM  Result Value Ref Range Status   Specimen Description URINE, RANDOM  Final   Special Requests   Final    NONE Performed at [facility], [address], [place], [place] [address]    Culture >=100,000 COLONIES/mL ESCHERICHIA COLI (A)  Final   Report Status 11/03/2018 FINAL  Final   Organism ID, Bacteria ESCHERICHIA COLI (A)  Final      Susceptibility   Escherichia coli - MIC*    AMPICILLIN 16 INTERMEDIATE  Intermediate     CEFAZOLIN <=4 SENSITIVE Sensitive     CEFTRIAXONE <=1 SENSITIVE Sensitive     CIPROFLOXACIN <=0.25 SENSITIVE Sensitive     GENTAMICIN <=1 SENSITIVE Sensitive     IMIPENEM <=0.25 SENSITIVE Sensitive     NITROFURANTOIN <=16 SENSITIVE Sensitive     TRIMETH/SULFA <=20 SENSITIVE Sensitive     AMPICILLIN/SULBACTAM 4 SENSITIVE Sensitive     PIP/TAZO <=4 SENSITIVE Sensitive     Extended ESBL NEGATIVE Sensitive     * >=100,000 COLONIES/mL ESCHERICHIA COLI  Culture, blood (routine x 2)     Status: None   Collection Time: 11/02/18  5:54 AM  Result Value Ref Range Status   Specimen Description BLOOD RIGHT HAND  Final   Special Requests   Final    BOTTLES DRAWN AEROBIC ONLY Blood Culture adequate volume   Culture   Final    NO GROWTH 5 DAYS Performed at [facility], [place] [address] [place], [place] [address]    Report Status 11/07/2018 FINAL  Final  Culture, blood (routine x 2)     Status: None   Collection Time: 11/02/18  5:54 AM  Result Value Ref Range Status   Specimen Description BLOOD LEFT ANTECUBITAL  Final   Special Requests   Final    BOTTLES DRAWN AEROBIC ONLY Blood Culture adequate volume   Culture   Final    NO GROWTH 5 DAYS Performed at [facility], [address], [place], [place] [address]    Report Status 11/07/2018 FINAL  Final     Labs: BNP (last 3 results) No results for input(s): BNP in the last 8760 hours. Basic Metabolic Panel: Recent Labs  Lab 11/02/18 0504 11/03/18 0642 11/04/18 0022 11/05/18 0632 11/06/18 0749  NA 139 142 138 144 147*  K 3.0* 3.4* 3.3* 3.3* 3.5  CL 106 111 106 113* 115*  CO2 21* 20* 19* 21* 24  GLUCOSE 138* 131* 146* 134* 101*  BUN 15 18 20 14 10   CREATININE 1.29* 1.41* 1.33* 1.15* 0.94  CALCIUM 8.4* 8.2* 8.2* 8.5* 8.8*   Liver Function Tests: Recent Labs  Lab 11/01/18 2101 11/02/18 0504  AST 24 20  ALT 21 17  ALKPHOS 75 58  BILITOT 0.9 0.6  PROT 6.8 5.3*  ALBUMIN 4.0 3.0*   Recent Labs   Lab 11/01/18 2101  LIPASE 20   No results for input(s): AMMONIA in the last 168 hours. CBC: Recent Labs  Lab 11/01/18 2101 11/02/18 0504  11/03/18 4166 11/03/18 1429 11/04/18 0022 11/05/18 0632 11/06/18 1017  WBC 18.2* 17.2*   < > 18.5* 17.2* 7.3 10.5 10.8*  NEUTROABS 16.5* 15.1*  --   --   --   --   --   --   HGB 14.0 11.9*   < > 10.8* 10.9* 11.0* 10.4* 10.3*  HCT 43.6 37.0   < > 33.2* 32.8* 33.9* 32.0* 31.3*  MCV 82.7 83.0   < > 82.2 81.8 80.7 80.8 81.5  PLT 303 249   < > 187 156 167 177 208   < > = values in this interval not displayed.   Cardiac Enzymes: No results for input(s): CKTOTAL, CKMB, CKMBINDEX, TROPONINI in the last 168 hours. BNP: Invalid input(s): POCBNP CBG: Recent Labs  Lab 11/06/18 1117 11/06/18 1630 11/06/18 2212 11/07/18 0647 11/07/18 1111  GLUCAP 92 75 86 83 87   D-Dimer No results for input(s): DDIMER in the last 72 hours. Hgb A1c No results for input(s): HGBA1C in the last 72 hours. Lipid Profile No results for input(s): CHOL, HDL, LDLCALC, TRIG, CHOLHDL, LDLDIRECT in the last 72 hours. Thyroid function studies No results for input(s): TSH, T4TOTAL, T3FREE, THYROIDAB in the last 72 hours.  Invalid input(s): FREET3 Anemia work up No results for input(s): VITAMINB12, FOLATE, FERRITIN, TIBC, IRON, RETICCTPCT in the last 72 hours. Urinalysis    Component Value Date/Time   COLORURINE YELLOW 11/01/2018 2216   APPEARANCEUR HAZY (A) 11/01/2018 2216   LABSPEC 1.021 11/01/2018 2216   PHURINE 5.0 11/01/2018 2216   GLUCOSEU NEGATIVE 11/01/2018 2216   HGBUR MODERATE (A) 11/01/2018 2216   BILIRUBINUR NEGATIVE 11/01/2018 2216   KETONESUR 5 (A) 11/01/2018 2216   PROTEINUR NEGATIVE 11/01/2018 2216   NITRITE POSITIVE (A) 11/01/2018 2216   LEUKOCYTESUR MODERATE (A) 11/01/2018 2216   Sepsis Labs Invalid input(s): PROCALCITONIN,  WBC,  LACTICIDVEN Microbiology Recent Results (from the past 240 hour(s))  Urine culture     Status: Abnormal    Collection Time: 11/01/18 10:16 PM  Result Value Ref Range Status   Specimen Description URINE, RANDOM  Final   Special Requests   Final    NONE Performed at [facility], [place] [address], [place], [place] [address]  Culture >=100,000 COLONIES/mL ESCHERICHIA COLI (A)  Final   Report Status 11/03/2018 FINAL  Final   Organism ID, Bacteria ESCHERICHIA COLI (A)  Final      Susceptibility   Escherichia coli - MIC*    AMPICILLIN 16 INTERMEDIATE Intermediate     CEFAZOLIN <=4 SENSITIVE Sensitive     CEFTRIAXONE <=1 SENSITIVE Sensitive     CIPROFLOXACIN <=0.25 SENSITIVE Sensitive     GENTAMICIN <=1 SENSITIVE Sensitive     IMIPENEM <=0.25 SENSITIVE Sensitive     NITROFURANTOIN <=16 SENSITIVE Sensitive     TRIMETH/SULFA <=20 SENSITIVE Sensitive     AMPICILLIN/SULBACTAM 4 SENSITIVE Sensitive     PIP/TAZO <=4 SENSITIVE Sensitive     Extended ESBL NEGATIVE Sensitive     * >=100,000 COLONIES/mL ESCHERICHIA COLI  Culture, blood (routine x 2)     Status: None   Collection Time: 11/02/18  5:54 AM  Result Value Ref Range Status   Specimen Description BLOOD RIGHT HAND  Final   Special Requests   Final    BOTTLES DRAWN AEROBIC ONLY Blood Culture adequate volume   Culture   Final    NO GROWTH 5 DAYS Performed at [facility], [place] [address] [place], [place] [address]    Report Status 11/07/2018 FINAL  Final  Culture, blood (routine x 2)     Status: None   Collection Time: 11/02/18  5:54 AM  Result Value Ref Range Status   Specimen Description BLOOD LEFT ANTECUBITAL  Final   Special Requests   Final    BOTTLES DRAWN AEROBIC ONLY Blood Culture adequate volume   Culture   Final    NO GROWTH 5 DAYS Performed at [facility], [place] [address], [place], [place] [address]    Report Status 11/07/2018 FINAL  Final     Time coordinating discharge: 40 minutes  SIGNED:   Elmarie Shiley, MD  [facility]

## 2018-11-07 NOTE — Progress Notes (Signed)
Emily Benton Floor to be D/C'd Home per MD order.  Discussed prescriptions and follow up appointments with the patient. Prescriptions given to patient, medication list explained in detail. Pt verbalized understanding.  Allergies as of 11/07/2018      Reactions   Codeine Nausea And Vomiting      Medication List    STOP taking these medications   clindamycin 300 MG capsule Commonly known as:  CLEOCIN   hydrochlorothiazide 25 MG tablet Commonly known as:  HYDRODIURIL   oxyCODONE-acetaminophen 5-325 MG tablet Commonly known as:  PERCOCET/ROXICET     TAKE these medications   acetaminophen 500 MG tablet Commonly known as:  TYLENOL Take 500-1,000 mg by mouth every 6 (six) hours as needed for mild pain or headache.   amLODipine 5 MG tablet Commonly known as:  NORVASC Take 1 tablet (5 mg total) by mouth daily.   blood glucose meter kit and supplies Dispense based on patient and insurance preference. Use up to four times daily as directed. (FOR ICD-10 E10.9, E11.9).   buPROPion 300 MG 24 hr tablet Commonly known as:  WELLBUTRIN XL Take 300 mg by mouth daily.   cephALEXin 500 MG capsule Commonly known as:  KEFLEX Take 1 capsule (500 mg total) by mouth every 12 (twelve) hours for 5 days.   GOODY HEADACHE PO Take 1 packet by mouth as needed (for headaches or pain).   LORazepam 0.5 MG tablet Commonly known as:  ATIVAN Take 0.5 mg by mouth daily as needed for anxiety.   saccharomyces boulardii 250 MG capsule Commonly known as:  FLORASTOR Take 1 capsule (250 mg total) by mouth 2 (two) times daily.       Vitals:   11/07/18 0429 11/07/18 0927  BP: (!) 142/82 (!) 154/89  Pulse: 91 85  Resp: 16 18  Temp: 98.1 F (36.7 C) 98 F (36.7 C)  SpO2: 97% 97%    Skin clean, dry and intact without evidence of skin break down, no evidence of skin tears noted. IV catheter discontinued intact. Site without signs and symptoms of complications. Dressing and pressure applied. Pt denies  pain at this time. No complaints noted.  An After Visit Summary was printed and given to the patient. Patient escorted via [place], and D/C home via private auto.

## 2018-11-07 NOTE — Discharge Instructions (Signed)
Pyelonephritis, Adult    Pyelonephritis is a kidney infection. The kidneys are organs that help clean your blood by moving waste out of your blood and into your pee (urine). This infection can happen quickly, or it can last for a long time. In most cases, it clears up with treatment and does not cause other problems.  Follow these instructions at home:  Medicines  · Take over-the-counter and prescription medicines only as told by your doctor.  · Take your antibiotic medicine as told by your doctor. Do not stop taking the medicine even if you start to feel better.  General instructions  · Drink enough fluid to keep your pee clear or pale yellow.  · Avoid caffeine, tea, and carbonated drinks.  · Pee (urinate) often. Avoid holding in pee for long periods of time.  · Pee before and after sex.  · After pooping (having a bowel movement), women should wipe from front to back. Use each tissue only once.  · Keep all follow-up visits as told by your doctor. This is important.  Contact a doctor if:  · You do not feel better after 2 days.  · Your symptoms get worse.  · You have a fever.  Get help right away if:  · You cannot take your medicine or drink fluids as told.  · You have chills and shaking.  · You throw up (vomit).  · You have very bad pain in your side (flank) or back.  · You feel very weak or you pass out (faint).  This information is not intended to replace advice given to you by your health care provider. Make sure you discuss any questions you have with your health care provider.  Document Released: 08/22/2004 Document Revised: 12/21/2015 Document Reviewed: 11/07/2014  Elsevier Interactive Patient Education © 2019 [employer]

## 2018-11-26 ENCOUNTER — Other Ambulatory Visit (HOSPITAL_COMMUNITY): Payer: Self-pay | Admitting: Urology

## 2018-11-26 DIAGNOSIS — N1330 Unspecified hydronephrosis: Secondary | ICD-10-CM

## 2018-12-09 ENCOUNTER — Other Ambulatory Visit: Payer: Self-pay

## 2018-12-09 ENCOUNTER — Encounter (HOSPITAL_COMMUNITY)
Admission: RE | Admit: 2018-12-09 | Discharge: 2018-12-09 | Disposition: A | Discharge status: Home / Self Care | Payer: Medicare Other | Source: Ambulatory Visit | Attending: Urology | Admitting: Urology

## 2018-12-09 DIAGNOSIS — N1330 Unspecified hydronephrosis: Secondary | ICD-10-CM | POA: Insufficient documentation

## 2018-12-09 MED ORDER — FUROSEMIDE 10 MG/ML IJ SOLN
INTRAMUSCULAR | Status: AC
  Filled 2018-12-09: qty 4

## 2018-12-09 MED ORDER — TECHNETIUM TC 99M MERTIATIDE
5.3000 | Freq: Once | INTRAVENOUS | Status: AC | PRN
  Administered 2018-12-09: 5.3 via INTRAVENOUS

## 2018-12-09 MED ORDER — FUROSEMIDE 10 MG/ML IJ SOLN
34.0000 mg | Freq: Once | INTRAMUSCULAR | Status: AC
  Administered 2018-12-09: 34 mg via INTRAVENOUS

## 2018-12-09 MED ORDER — FUROSEMIDE 10 MG/ML IJ SOLN
35.0000 mg/h | INTRAVENOUS | Status: DC
  Filled 2018-12-09: qty 25

## 2019-04-06 ENCOUNTER — Other Ambulatory Visit: Payer: Self-pay | Admitting: Internal Medicine

## 2019-04-06 DIAGNOSIS — Z1231 Encounter for screening mammogram for malignant neoplasm of breast: Secondary | ICD-10-CM

## 2019-04-07 ENCOUNTER — Other Ambulatory Visit: Payer: Self-pay

## 2019-04-07 ENCOUNTER — Ambulatory Visit
Admission: RE | Admit: 2019-04-07 | Discharge: 2019-04-07 | Disposition: A | Discharge status: Home / Self Care | Payer: Medicare Other | Source: Ambulatory Visit | Attending: Internal Medicine | Admitting: Internal Medicine

## 2019-04-07 DIAGNOSIS — Z1231 Encounter for screening mammogram for malignant neoplasm of breast: Secondary | ICD-10-CM

## 2019-08-04 ENCOUNTER — Other Ambulatory Visit: Payer: Self-pay | Admitting: Internal Medicine

## 2019-08-04 DIAGNOSIS — R935 Abnormal findings on diagnostic imaging of other abdominal regions, including retroperitoneum: Secondary | ICD-10-CM

## 2019-08-21 ENCOUNTER — Ambulatory Visit: Payer: Medicare Other | Attending: Internal Medicine

## 2019-08-21 DIAGNOSIS — Z23 Encounter for immunization: Secondary | ICD-10-CM | POA: Insufficient documentation

## 2019-08-26 ENCOUNTER — Other Ambulatory Visit: Payer: Self-pay | Admitting: Internal Medicine

## 2019-08-26 DIAGNOSIS — M8588 Other specified disorders of bone density and structure, other site: Secondary | ICD-10-CM

## 2019-08-30 ENCOUNTER — Other Ambulatory Visit: Payer: Self-pay

## 2019-08-30 ENCOUNTER — Ambulatory Visit
Admission: RE | Admit: 2019-08-30 | Discharge: 2019-08-30 | Disposition: A | Discharge status: Home / Self Care | Payer: Medicare Other | Source: Ambulatory Visit | Attending: Internal Medicine | Admitting: Internal Medicine

## 2019-08-30 DIAGNOSIS — R935 Abnormal findings on diagnostic imaging of other abdominal regions, including retroperitoneum: Secondary | ICD-10-CM

## 2019-09-18 ENCOUNTER — Ambulatory Visit: Payer: Medicare Other

## 2019-09-25 ENCOUNTER — Ambulatory Visit: Payer: Medicare Other | Attending: Internal Medicine

## 2019-09-25 DIAGNOSIS — Z23 Encounter for immunization: Secondary | ICD-10-CM | POA: Insufficient documentation

## 2019-09-25 NOTE — Progress Notes (Signed)
   Covid-19 Vaccination Clinic  Name:  Emily Benton    MRN: [id number] DOB: Apr 23, 1946  09/25/2019  Emily Benton was observed post Covid-19 immunization for 15 minutes without incidence. She was provided with Vaccine Information Sheet and instruction to access the V-Safe system.   Emily Benton was instructed to call 911 with any severe reactions post vaccine: Marland Kitchen Difficulty breathing  . Swelling of your face and throat  . A fast heartbeat  . A bad rash all over your body  . Dizziness and weakness    Immunizations Administered    Name Date Dose VIS Date Route   Moderna COVID-19 Vaccine 09/25/2019 10:22 AM 0.5 mL 06/29/2019 Intramuscular   Manufacturer: Moderna   Lot: [id number]   NDC: 74944-967-59

## 2019-11-10 ENCOUNTER — Other Ambulatory Visit: Payer: Self-pay

## 2019-11-10 ENCOUNTER — Ambulatory Visit
Admission: RE | Admit: 2019-11-10 | Discharge: 2019-11-10 | Disposition: A | Discharge status: Home / Self Care | Payer: Medicare Other | Source: Ambulatory Visit | Attending: Internal Medicine | Admitting: Internal Medicine

## 2019-11-10 DIAGNOSIS — M8588 Other specified disorders of bone density and structure, other site: Secondary | ICD-10-CM

## 2020-04-13 ENCOUNTER — Ambulatory Visit (INDEPENDENT_AMBULATORY_CARE_PROVIDER_SITE_OTHER): Payer: Medicare Other

## 2020-04-13 ENCOUNTER — Encounter (HOSPITAL_COMMUNITY): Payer: Self-pay | Admitting: Emergency Medicine

## 2020-04-13 ENCOUNTER — Ambulatory Visit (HOSPITAL_COMMUNITY): Payer: Self-pay

## 2020-04-13 ENCOUNTER — Ambulatory Visit (HOSPITAL_COMMUNITY)
Admission: EM | Admit: 2020-04-13 | Discharge: 2020-04-13 | Disposition: A | Discharge status: Home / Self Care | Payer: Medicare Other | Attending: Family Medicine | Admitting: Family Medicine

## 2020-04-13 ENCOUNTER — Other Ambulatory Visit: Payer: Self-pay

## 2020-04-13 DIAGNOSIS — M25471 Effusion, right ankle: Secondary | ICD-10-CM

## 2020-04-13 DIAGNOSIS — M25571 Pain in right ankle and joints of right foot: Secondary | ICD-10-CM | POA: Diagnosis not present

## 2020-04-13 MED ORDER — PREDNISONE 10 MG PO TABS: 20.0000 mg | ORAL_TABLET | Freq: Every day | ORAL | 0 refills | Status: AC

## 2020-04-13 NOTE — Discharge Instructions (Addendum)
X-ray without any fractures Most likely some arthritis or tendinitis. We will treat with some low-dose steroid over the next couple days. Rest, ice, elevate. Ace wrap for compression Follow up as needed for continued or worsening symptoms

## 2020-04-13 NOTE — ED Triage Notes (Signed)
Pt presents with R leg and ankle pain xs 1 wk. States swelling started 3-4 days ago. Denies any falls or injury.

## 2020-04-17 NOTE — ED Provider Notes (Addendum)
[place]    CSN: [id number] Arrival date & time: 04/13/20  0848      History   Chief Complaint Chief Complaint  Patient presents with  . Leg Injury    HPI Emily Benton is a 74 y.o. female.   Patient is a 74 year old female presents today with right leg and ankle pain x1 week.  States swelling started 3 to 4 days ago.  Denies any specific injuries or falls. Able to ambulate but with pain.  No fever he denies any history of gout.     Past Medical History:  Diagnosis Date  . Hypertension     Patient Active Problem List   Diagnosis Date Noted  . Sepsis ([place]) 11/02/2018  . New onset type 2 diabetes mellitus ([place]) 11/02/2018  . Pyelonephritis of right kidney 11/01/2018  . SIRS (systemic inflammatory response syndrome) ([place]) 11/01/2018  . ARF (acute renal failure) ([place]) 11/01/2018    Past Surgical History:  Procedure Laterality Date  . ABDOMINAL HYSTERECTOMY    . BREAST CYST ASPIRATION      OB History   No obstetric history on file.      Home Medications    Prior to Admission medications   Medication Sig Start Date End Date Taking? Authorizing Provider  acetaminophen (TYLENOL) 500 MG tablet Take 500-1,000 mg by mouth every 6 (six) hours as needed for mild pain or headache.     [provider]  amLODipine (NORVASC) 5 MG tablet Take 1 tablet (5 mg total) by mouth daily. 11/07/18   Regalado, Belkys A, MD  Aspirin-Acetaminophen-Caffeine (GOODY HEADACHE PO) Take 1 packet by mouth as needed (for headaches or pain).    [provider]  blood glucose meter kit and supplies Dispense based on patient and insurance preference. Use up to four times daily as directed. (FOR ICD-10 E10.9, E11.9). 11/07/18   Regalado, Belkys A, MD  buPROPion (WELLBUTRIN XL) 300 MG 24 hr tablet Take 300 mg by mouth daily.    [provider]  LORazepam (ATIVAN) 0.5 MG tablet Take 0.5 mg by mouth daily as needed for anxiety.    [provider]   predniSONE (DELTASONE) 10 MG tablet Take 2 tablets (20 mg total) by mouth daily for 5 days. 04/13/20 04/18/20  Orvan July, NP  saccharomyces boulardii (FLORASTOR) 250 MG capsule Take 1 capsule (250 mg total) by mouth 2 (two) times daily. 11/07/18   Regalado, Cassie Freer, MD    Family History Family History  Problem Relation Age of Onset  . Diabetes Mellitus II Mother   . Breast cancer Mother   . Diabetes Mellitus II Father   . CAD Neg Hx     Social History Social History   Tobacco Use  . Smoking status: Former Research scientist (life sciences)  . Smokeless tobacco: Never Used  Vaping Use  . Vaping Use: Never used  Substance Use Topics  . Alcohol use: No  . Drug use: No     Allergies   Codeine   Review of Systems Review of Systems   Physical Exam Triage Vital Signs ED Triage Vitals  Enc Vitals Group     BP 04/13/20 1022 (!) 161/97     Pulse Rate 04/13/20 1022 84     Resp 04/13/20 1022 18     Temp 04/13/20 1022 (!) 97.5 F (36.4 C)     Temp Source 04/13/20 1022 Oral     SpO2 04/13/20 1022 97 %     Weight --  Height --      Head Circumference --      Peak Flow --      Pain Score 04/13/20 1021 5     Pain Loc --      Pain Edu? --      Excl. in [place]? --    No data found.  Updated Vital Signs BP (!) 161/97 (BP Location: Right Arm)   Pulse 84   Temp (!) 97.5 F (36.4 C) (Oral)   Resp 18   SpO2 97%   Visual Acuity Right Eye Distance:   Left Eye Distance:   Bilateral Distance:    Right Eye Near:   Left Eye Near:    Bilateral Near:     Physical Exam Vitals and nursing note reviewed.  Constitutional:      General: She is not in acute distress.    Appearance: Normal appearance. She is not ill-appearing, toxic-appearing or diaphoretic.  HENT:     Head: Normocephalic.     Nose: Nose normal.  Eyes:     Conjunctiva/sclera: Conjunctivae normal.  Pulmonary:     Effort: Pulmonary effort is normal.  Musculoskeletal:     Cervical back: Normal range of motion.     Right ankle:  Swelling present. Tenderness present over the lateral malleolus. Decreased range of motion.  Skin:    General: Skin is warm and dry.     Findings: No rash.  Neurological:     Mental Status: She is alert.  Psychiatric:        Mood and Affect: Mood normal.      UC Treatments / Results  Labs (all labs ordered are listed, but only abnormal results are displayed) Labs Reviewed - No data to display  EKG   Radiology No results found.  Procedures Procedures (including critical care time)  Medications Ordered in UC Medications - No data to display  Initial Impression / Assessment and Plan / UC Course  I have reviewed the triage vital signs and the nursing notes.  Pertinent labs & imaging results that were available during my care of the patient were reviewed by me and considered in my medical decision making (see chart for details).     Ankle pain X-ray without any fractures. Most likely arthritis or tendinitis related. Low-dose steroid over the next couple days.  Rest, ice, elevate.  Ace wrap for compression. Follow up as needed for continued or worsening symptoms  Final Clinical Impressions(s) / UC Diagnoses   Final diagnoses:  Pain in joint involving right ankle and foot     Discharge Instructions     X-ray without any fractures Most likely some arthritis or tendinitis. We will treat with some low-dose steroid over the next couple days. Rest, ice, elevate. Ace wrap for compression Follow up as needed for continued or worsening symptoms     ED Prescriptions    Medication Sig Dispense Auth. Provider   predniSONE (DELTASONE) 10 MG tablet Take 2 tablets (20 mg total) by mouth daily for 5 days. 10 tablet Loura Halt A, NP     PDMP not reviewed this encounter.   Orvan July, NP 04/17/20 1610    Orvan July, NP 04/17/20 [phone]

## 2020-04-28 ENCOUNTER — Other Ambulatory Visit: Payer: Self-pay | Admitting: Internal Medicine

## 2020-04-28 DIAGNOSIS — Z1231 Encounter for screening mammogram for malignant neoplasm of breast: Secondary | ICD-10-CM

## 2020-05-19 ENCOUNTER — Ambulatory Visit
Admission: RE | Admit: 2020-05-19 | Discharge: 2020-05-19 | Disposition: A | Discharge status: Home / Self Care | Payer: Medicare Other | Source: Ambulatory Visit | Attending: Internal Medicine | Admitting: Internal Medicine

## 2020-05-19 ENCOUNTER — Other Ambulatory Visit: Payer: Self-pay

## 2020-05-19 DIAGNOSIS — Z1231 Encounter for screening mammogram for malignant neoplasm of breast: Secondary | ICD-10-CM

## 2020-05-26 ENCOUNTER — Ambulatory Visit: Payer: Medicare Other | Attending: Internal Medicine

## 2020-05-26 DIAGNOSIS — Z23 Encounter for immunization: Secondary | ICD-10-CM

## 2020-05-26 NOTE — Progress Notes (Signed)
   Covid-19 Vaccination Clinic  Name:  Emily Benton    MRN: [id number] DOB: [birth year]  05/26/2020  Emily Benton was observed post Covid-19 immunization for 15 minutes without incident. She was provided with Vaccine Information Sheet and instruction to access the V-Safe system.   Emily Benton was instructed to call 911 with any severe reactions post vaccine: Marland Kitchen Difficulty breathing  . Swelling of face and throat  . A fast heartbeat  . A bad rash all over body  . Dizziness and weakness

## 2020-06-02 ENCOUNTER — Ambulatory Visit: Payer: Self-pay

## 2020-06-02 NOTE — Telephone Encounter (Signed)
Patient called stating that she had the moderna booster Oct. 29.  She states she had a small amount of itching and pain the second day at the injection site.  Today she is calling because she is having extreme pain left side from her hip into her toes. she rates the pain at 9.   She states that it is constant and feels that it is a reaction from the vaccine.  She did not have any reaction to the 1st and 2nd vaccines. She has never reacted to any vaccine. She has not injured herself or her back. She does not have fever.  Per protocol patient was instructed to contact her PCP today and if unable to see him to go to UC for evaluation of this unusual pain. She verbalized understanding and will follow the plan of care.  I encouraged her to call back for other concerns anytime.  Reason for Disposition . [1] Pain, tenderness, or swelling at the injection site AND [2] over 3 days (72 hours) since vaccine AND [3] getting worse  Answer Assessment - Initial Assessment Questions 1. MAIN CONCERN OR SYMPTOM:  "What is your main concern right now?" "What question do you have?" "What's the main symptom you're worried about?" (e.g., fever, pain, redness, swelling)     Pain all over left side left hip down 2. VACCINE: "What vaccination did you receive?" "Is this your first or second shot?" (e.g., none; AstraZeneca, J&J, Moderna, [employer], other)     moderna Oct 29th booster 3. SYMPTOM ONSET: "When did the pain begin?" (e.g., not relevant; hours, days)     Itching and swelling 2nd day  Then pain after there itching and swelling went away 4. SYMPTOM SEVERITY: "How bad is it?"      Pain 9 left side starting from hip down 5. FEVER: "Is there a fever?" If Yes, ask: "What is it, how was it measured, and when did it start?"      no 6. PAST REACTIONS: "Have you reacted to immunizations before?" If Yes, ask: "What happened?"    none 7. OTHER SYMPTOMS: "Do you have any other symptoms?"     None  Protocols used: CORONAVIRUS  (COVID-19) VACCINE QUESTIONS AND REACTIONS-A-AH

## 2020-06-05 ENCOUNTER — Other Ambulatory Visit: Payer: Self-pay

## 2020-06-05 ENCOUNTER — Encounter (HOSPITAL_COMMUNITY): Payer: Self-pay

## 2020-06-05 ENCOUNTER — Ambulatory Visit (HOSPITAL_COMMUNITY)
Admission: EM | Admit: 2020-06-05 | Discharge: 2020-06-05 | Disposition: A | Discharge status: Home / Self Care | Payer: Medicare Other | Attending: Nurse Practitioner | Admitting: Nurse Practitioner

## 2020-06-05 DIAGNOSIS — M791 Myalgia, unspecified site: Secondary | ICD-10-CM | POA: Diagnosis not present

## 2020-06-05 DIAGNOSIS — T50B95A Adverse effect of other viral vaccines, initial encounter: Secondary | ICD-10-CM

## 2020-06-05 MED ORDER — ETODOLAC 500 MG PO TABS: 500.0000 mg | ORAL_TABLET | Freq: Two times a day (BID) | ORAL | 0 refills | Status: AC | PRN

## 2020-06-05 MED ORDER — KETOROLAC TROMETHAMINE 60 MG/2ML IM SOLN
60.0000 mg | Freq: Once | INTRAMUSCULAR | Status: AC
  Administered 2020-06-05: 60 mg via INTRAMUSCULAR

## 2020-06-05 MED ORDER — KETOROLAC TROMETHAMINE 60 MG/2ML IM SOLN
INTRAMUSCULAR | Status: AC
  Filled 2020-06-05: qty 2

## 2020-06-05 NOTE — Discharge Instructions (Signed)
Take medications as prescribed  You may also alternate between applying ice and applying heat to the areas that hurt at least 3 times a day  Drink plenty of fluids

## 2020-06-05 NOTE — ED Triage Notes (Addendum)
Pt is here with left side body pain that aches to the bone that started 05/28/2020 after receiving her COVID booster on 05/26/2020. Pt has taken Tylenol to relieve discomfort.

## 2020-06-05 NOTE — ED Provider Notes (Signed)
[place]    CSN: [id number] Arrival date & time: 06/05/20  1157      History   Chief Complaint Chief Complaint  Patient presents with  . Body pain    HPI Emily Benton is a 74 y.o. female.   Emily Benton is a 74 y.o. female who presents for evaluation of myalgias which have been present for 1 week. Notably, the myalgias started 2 days after receiving her COVID booster shot. The injection was given in the left arm. Her pain started in the left arm and hs radiated down her entire left side of body. The pain is described as constant. It is moderate in severity. It is described as aching and throbbing. She denies any unilateral weakness, paraesthesias, difficulty ambulating, decreased range of motion, edema, erythema or warmth. The patient tried aleve twice since symptom onset for pain which provided some relief but she hasn't tried anymore since.        Past Medical History:  Diagnosis Date  . Hypertension     Patient Active Problem List   Diagnosis Date Noted  . Sepsis ([place]) 11/02/2018  . New onset type 2 diabetes mellitus ([place]) 11/02/2018  . Pyelonephritis of right kidney 11/01/2018  . SIRS (systemic inflammatory response syndrome) ([place]) 11/01/2018  . ARF (acute renal failure) ([place]) 11/01/2018    Past Surgical History:  Procedure Laterality Date  . ABDOMINAL HYSTERECTOMY    . BREAST CYST ASPIRATION      OB History   No obstetric history on file.      Home Medications    Prior to Admission medications   Medication Sig Start Date End Date Taking? Authorizing Provider  acetaminophen (TYLENOL) 500 MG tablet Take 500-1,000 mg by mouth every 6 (six) hours as needed for mild pain or headache.     [provider]  amLODipine (NORVASC) 5 MG tablet Take 1 tablet (5 mg total) by mouth daily. 11/07/18   Regalado, Belkys A, MD  Aspirin-Acetaminophen-Caffeine (GOODY HEADACHE PO) Take 1 packet by mouth as needed (for headaches or pain).     [provider]  blood glucose meter kit and supplies Dispense based on patient and insurance preference. Use up to four times daily as directed. (FOR ICD-10 E10.9, E11.9). 11/07/18   Regalado, Belkys A, MD  buPROPion (WELLBUTRIN XL) 300 MG 24 hr tablet Take 300 mg by mouth daily.    [provider]  etodolac (LODINE) 500 MG tablet Take 1 tablet (500 mg total) by mouth 2 (two) times daily as needed (PAIN). 06/05/20   Enrique Sack, FNP  LORazepam (ATIVAN) 0.5 MG tablet Take 0.5 mg by mouth daily as needed for anxiety.    [provider]  saccharomyces boulardii (FLORASTOR) 250 MG capsule Take 1 capsule (250 mg total) by mouth 2 (two) times daily. 11/07/18   Regalado, Cassie Freer, MD    Family History Family History  Problem Relation Age of Onset  . Diabetes Mellitus II Mother   . Breast cancer Mother   . Diabetes Mellitus II Father   . CAD Neg Hx     Social History Social History   Tobacco Use  . Smoking status: Former Research scientist (life sciences)  . Smokeless tobacco: Never Used  Vaping Use  . Vaping Use: Never used  Substance Use Topics  . Alcohol use: No  . Drug use: No     Allergies   Codeine   Review of Systems Review of Systems  Constitutional: Negative for fever.  Gastrointestinal: Negative.  Musculoskeletal: Positive for myalgias.  Neurological: Negative.   All other systems reviewed and are negative.    Physical Exam Triage Vital Signs ED Triage Vitals  Enc Vitals Group     BP 06/05/20 1317 (!) 178/93     Pulse Rate 06/05/20 1317 82     Resp 06/05/20 1317 17     Temp 06/05/20 1317 97.6 F (36.4 C)     Temp Source 06/05/20 1317 Oral     SpO2 06/05/20 1317 98 %     Weight --      Height --      Head Circumference --      Peak Flow --      Pain Score 06/05/20 1315 8     Pain Loc --      Pain Edu? --      Excl. in [place]? --    No data found.  Updated Vital Signs BP (!) 178/93 (BP Location: Right Arm)   Pulse 82   Temp 97.6 F (36.4 C)  (Oral)   Resp 17   SpO2 98%   Visual Acuity Right Eye Distance:   Left Eye Distance:   Bilateral Distance:    Right Eye Near:   Left Eye Near:    Bilateral Near:     Physical Exam Vitals reviewed.  Constitutional:      General: She is not in acute distress.    Appearance: Normal appearance. She is not ill-appearing or toxic-appearing.  HENT:     Head: Normocephalic.  Cardiovascular:     Rate and Rhythm: Normal rate and regular rhythm.  Pulmonary:     Effort: Pulmonary effort is normal.     Breath sounds: Normal breath sounds.  Musculoskeletal:        General: No swelling, tenderness or deformity. Normal range of motion.     Cervical back: Normal range of motion and neck supple.  Skin:    General: Skin is warm and dry.  Neurological:     General: No focal deficit present.     Mental Status: She is alert and oriented to person, place, and time.  Psychiatric:        Mood and Affect: Mood normal.        Behavior: Behavior normal.      UC Treatments / Results  Labs (all labs ordered are listed, but only abnormal results are displayed) Labs Reviewed - No data to display  EKG   Radiology No results found.  Procedures Procedures (including critical care time)  Medications Ordered in UC Medications  ketorolac (TORADOL) injection 60 mg (has no administration in time range)    Initial Impression / Assessment and Plan / UC Course  I have reviewed the triage vital signs and the nursing notes.  Pertinent labs & imaging results that were available during my care of the patient were reviewed by me and considered in my medical decision making (see chart for details).    74 yo female presenting with myalgias after receiving COVID vaccine booster. Patient is afebrile. Nontoxic appearing. Physical exam unremarkable. No focal deficits noted on exam. Patient given Toradol injection in the clinic and will be prescribed Etodolac for pain management at home. Advised consistent  use of medication as well as alternating between ice and heat to affected areas.   Today's evaluation has revealed no signs of a dangerous process. Discussed diagnosis with patient and/or guardian. Patient and/or guardian aware of their diagnosis, possible red flag symptoms to watch out for  and need for close follow up. Patient and/or guardian understands verbal and written discharge instructions. Patient and/or guardian comfortable with plan and disposition.  Patient and/or guardian has a clear mental status at this time, good insight into illness (after discussion and teaching) and has clear judgment to make decisions regarding their care  This care was provided during an unprecedented National Emergency due to the Novel Coronavirus (COVID-19) pandemic. COVID-19 infections and transmission risks place heavy strains on healthcare resources.  As this pandemic evolves, our facility, providers, and staff strive to respond fluidly, to remain operational, and to provide care relative to available resources and information. Outcomes are unpredictable and treatments are without well-defined guidelines. Further, the impact of COVID-19 on all aspects of urgent care, including the impact to patients seeking care for reasons other than COVID-19, is unavoidable during this national emergency. At this time of the global pandemic, management of patients has significantly changed, even for non-COVID positive patients given high local and regional COVID volumes at this time requiring high healthcare system and resource utilization. The standard of care for management of both COVID suspected and non-COVID suspected patients continues to change rapidly at the local, regional, national, and global levels. This patient was worked up and treated to the best available but ever changing evidence and resources available at this current time.   Documentation was completed with the aid of voice recognition software. Transcription may  contain typographical errors. Final Clinical Impressions(s) / UC Diagnoses   Final diagnoses:  Myalgia after COVID-19 vaccination     Discharge Instructions     . Take medications as prescribed  . You may also alternate between applying ice and applying heat to the areas that hurt at least 3 times a day  . Drink plenty of fluids     ED Prescriptions    Medication Sig Dispense Auth. Provider   etodolac (LODINE) 500 MG tablet Take 1 tablet (500 mg total) by mouth 2 (two) times daily as needed (PAIN). 14 tablet Enrique Sack, FNP     PDMP not reviewed this encounter.   Enrique Sack, [place] 06/05/20 1402

## 2020-06-13 ENCOUNTER — Other Ambulatory Visit: Payer: Self-pay | Admitting: Internal Medicine

## 2020-06-13 ENCOUNTER — Ambulatory Visit
Admission: RE | Admit: 2020-06-13 | Discharge: 2020-06-13 | Disposition: A | Discharge status: Home / Self Care | Payer: Medicare Other | Source: Ambulatory Visit | Attending: Internal Medicine | Admitting: Internal Medicine

## 2020-06-13 DIAGNOSIS — M5430 Sciatica, unspecified side: Secondary | ICD-10-CM

## 2020-06-21 ENCOUNTER — Other Ambulatory Visit: Payer: Self-pay | Admitting: Internal Medicine

## 2020-06-21 DIAGNOSIS — M5430 Sciatica, unspecified side: Secondary | ICD-10-CM

## 2020-07-12 ENCOUNTER — Ambulatory Visit
Admission: RE | Admit: 2020-07-12 | Discharge: 2020-07-12 | Disposition: A | Discharge status: Home / Self Care | Payer: Medicare Other | Source: Ambulatory Visit | Attending: Internal Medicine | Admitting: Internal Medicine

## 2020-07-12 DIAGNOSIS — M5430 Sciatica, unspecified side: Secondary | ICD-10-CM

## 2021-04-16 ENCOUNTER — Other Ambulatory Visit: Payer: Self-pay | Admitting: Internal Medicine

## 2021-04-16 DIAGNOSIS — Z1231 Encounter for screening mammogram for malignant neoplasm of breast: Secondary | ICD-10-CM

## 2021-04-17 ENCOUNTER — Ambulatory Visit: Payer: Medicare Other

## 2021-05-22 ENCOUNTER — Ambulatory Visit
Admission: RE | Admit: 2021-05-22 | Discharge: 2021-05-22 | Disposition: A | Discharge status: Home / Self Care | Payer: Medicare Other | Source: Ambulatory Visit | Attending: Internal Medicine | Admitting: Internal Medicine

## 2021-05-22 ENCOUNTER — Other Ambulatory Visit: Payer: Self-pay

## 2021-05-22 DIAGNOSIS — Z1231 Encounter for screening mammogram for malignant neoplasm of breast: Secondary | ICD-10-CM

## 2021-06-01 IMAGING — MR MR LUMBAR SPINE W/O CM
4 of 5 series · 24 of 48 positions shown · non-contrast
Comparison: Plain films June 14, 2019

CLINICAL DATA: Low back pain with sciatica.

EXAM:
MRI LUMBAR SPINE WITHOUT CONTRAST
TECHNIQUE: Multiplanar, multisequence MR imaging of the lumbar spine was
performed. No intravenous contrast was administered.

[Series 5: T2 · sagittal · 4.0mm · 0.73mm/px · 6 of 15 slices shown (1 of 2)]
[im 1/15]
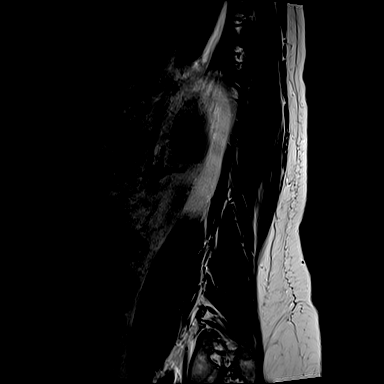
[im 3/15]
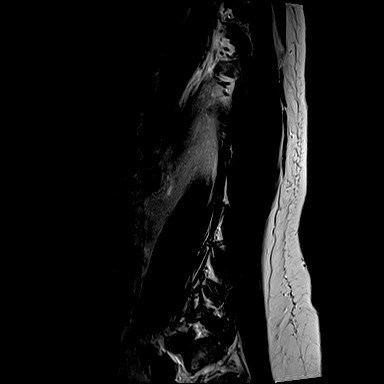
[im 6/15]
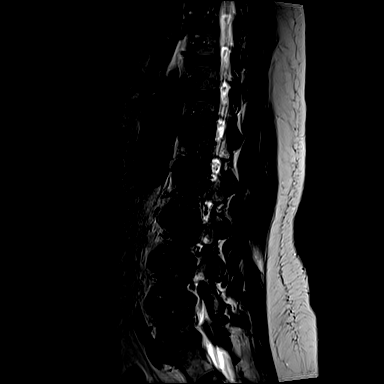
[im 9/15]
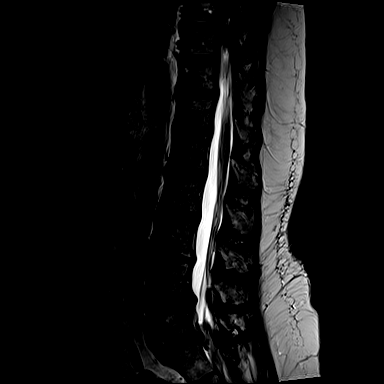
[im 12/15]
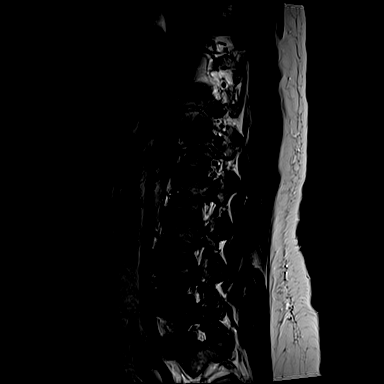
[im 15/15]
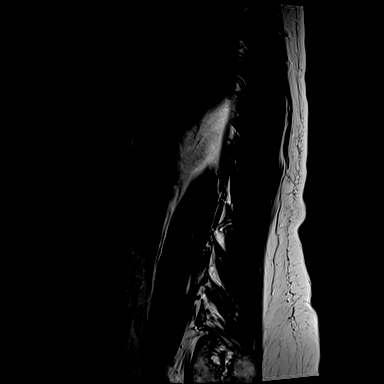

[Series 6: T1 · sagittal · 4.0mm · 1.09mm/px · 6 of 15 slices shown (1 of 2)]
[im 1/15]
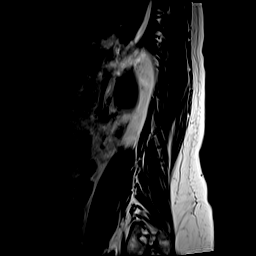
[im 3/15]
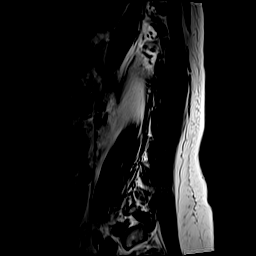
[im 6/15]
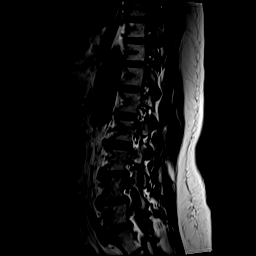
[im 9/15]
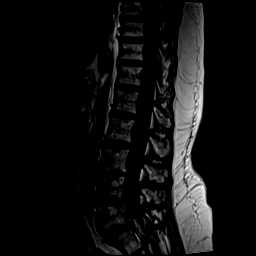
[im 12/15]
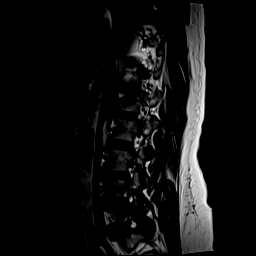
[im 15/15]
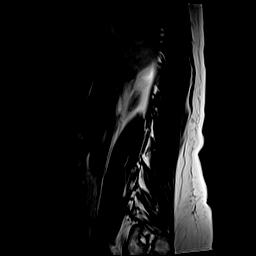

[Series 10: T1 · axial · 4.0mm · 0.28mm/px · z∈[-37,+100]mm · 3 of 39 slices shown (2 of 2)]
[im 6/39]
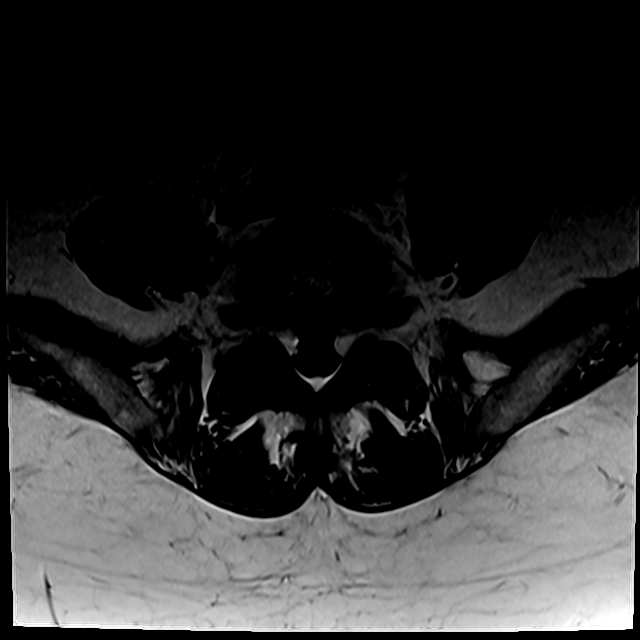
[im 20/39]
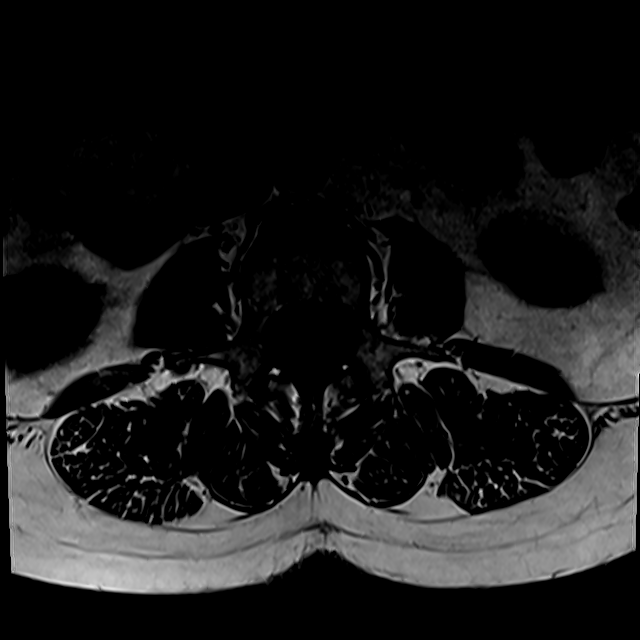
[im 33/39]
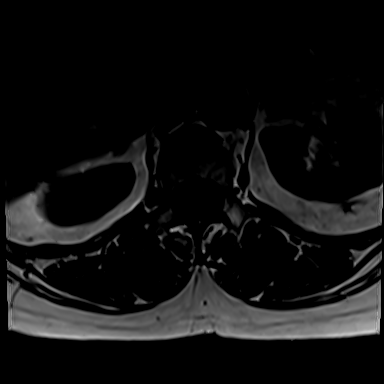

[Series 13: T2 · axial · 4.0mm · 0.28mm/px · z∈[-62,+130]mm · 9 of 39 slices shown (2 of 2)]
[im 1/39]
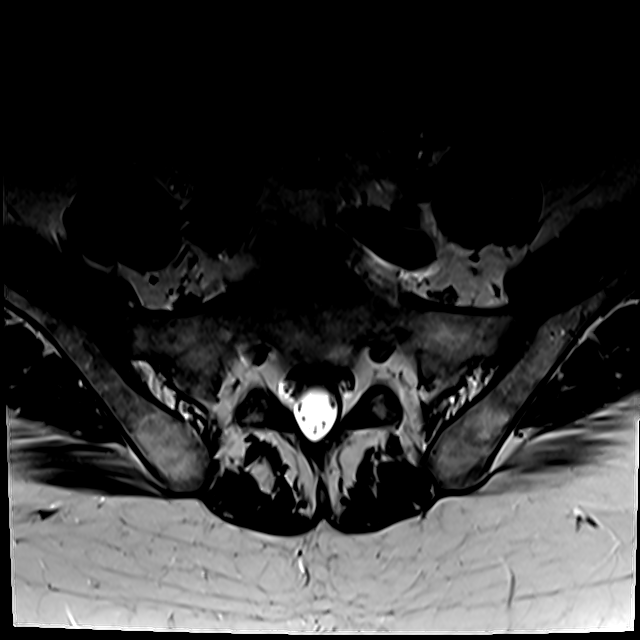
[im 6/39]
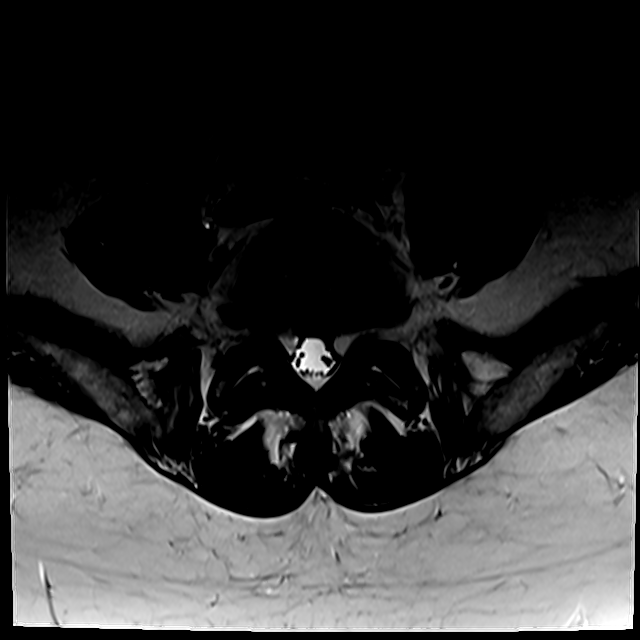
[im 11/39]
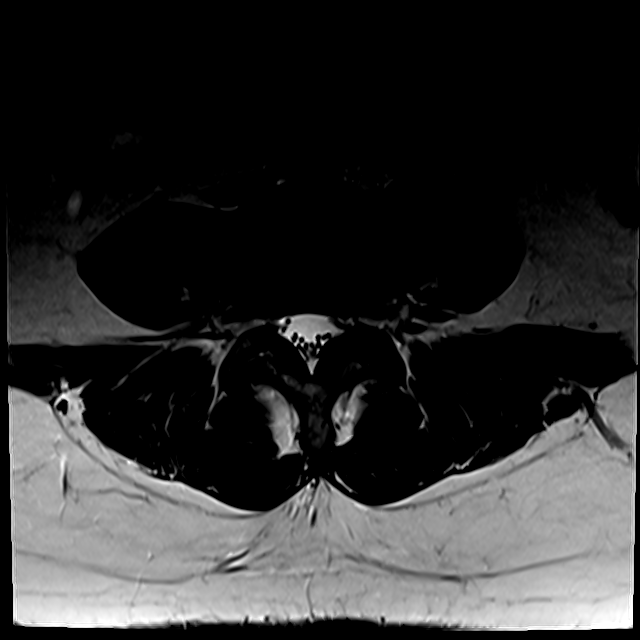
[im 17/39]
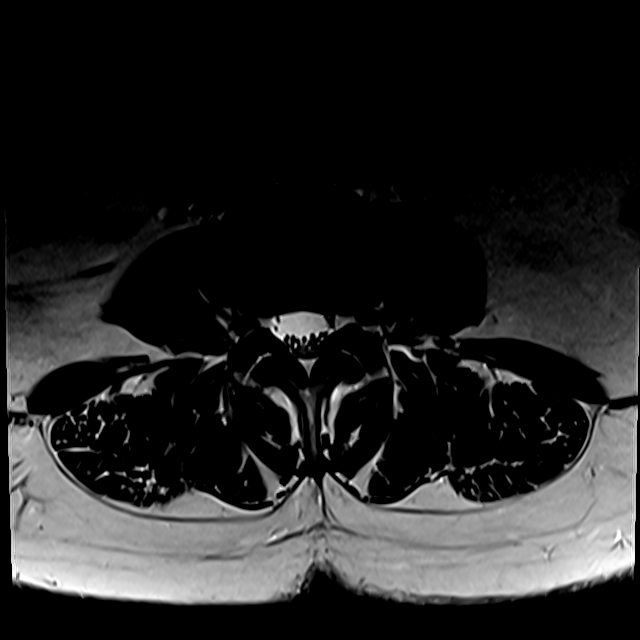
[im 20/39]
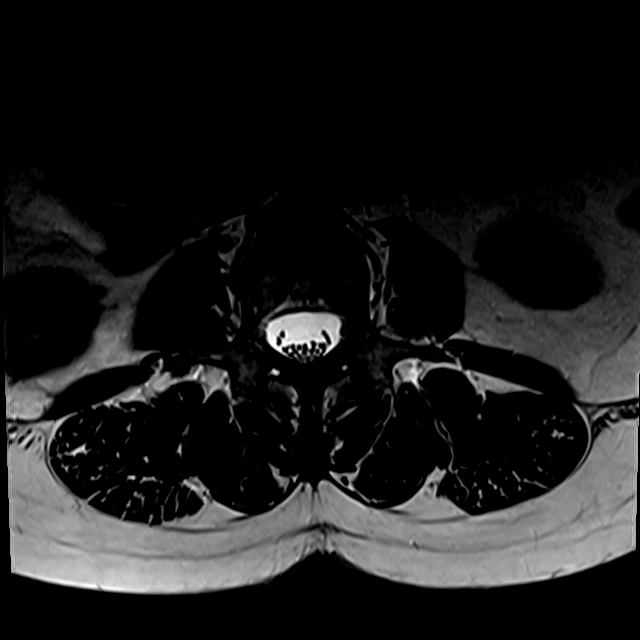
[im 22/39]
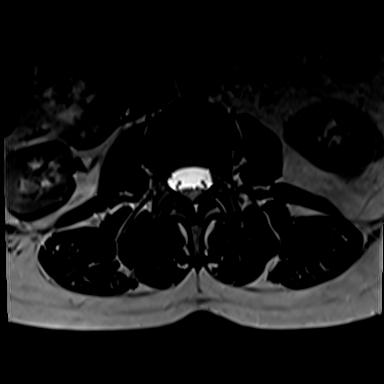
[im 28/39]
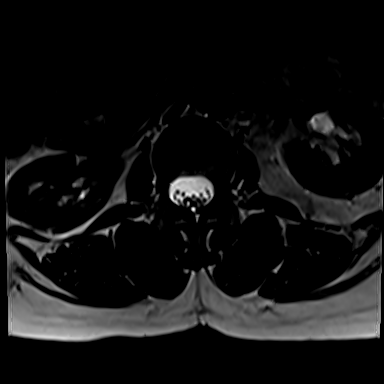
[im 33/39]
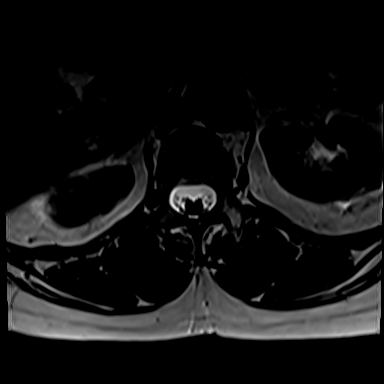
[im 39/39]
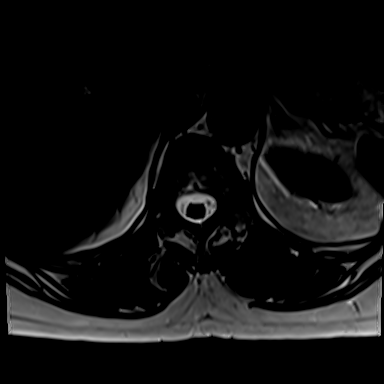

[24 of 48 positions shown; findings below may reference images not displayed]

FINDINGS: Segmentation:  Standard.

Alignment:  Physiologic.

Vertebrae:  No fracture, evidence of discitis, or bone lesion.

Conus medullaris and cauda equina: Conus extends to the L1-2 level.
Conus and cauda equina appear normal.

Paraspinal and other soft tissues: Negative.

Disc levels:

T12-L1:No spinal canal or neural foraminal stenosis.

L1-2:No spinal canal or neural foraminal stenosis.

L2-3:No spinal canal or neural foraminal stenosis.

L3-4:Mild loss of disc height, shallow disc bulge and mild facet
degenerative changes without significant spinal canal or neural
foraminal stenosis.

L4-5:Loss of disc height, disc bulge, moderate facet degenerative
changes and ligamentum flavum redundancy without significant spinal
canal or neural foraminal stenosis.

L5-S1:Disc bulge with superimposed central disc protrusion and
moderate facet degenerative changes without significant spinal canal
or neural foraminal stenosis.
IMPRESSION: 1. Mild multilevel degenerative changes of the lumbar spine without
significant spinal canal or neural foraminal stenosis at any level.
2. Moderate multilevel facet degenerative changes at L4-5 and L5-S1.

## 2022-02-27 ENCOUNTER — Ambulatory Visit (HOSPITAL_COMMUNITY)
Admission: EM | Admit: 2022-02-27 | Discharge: 2022-02-27 | Disposition: A | Discharge status: Home / Self Care | Payer: Medicare Other | Attending: Physician Assistant | Admitting: Physician Assistant

## 2022-02-27 ENCOUNTER — Encounter (HOSPITAL_COMMUNITY): Payer: Self-pay | Admitting: Physician Assistant

## 2022-02-27 DIAGNOSIS — L282 Other prurigo: Secondary | ICD-10-CM | POA: Diagnosis not present

## 2022-02-27 LAB — CBG MONITORING, ED: Glucose-Capillary: 113 mg/dL — ABNORMAL HIGH (ref 70–99)

## 2022-02-27 MED ORDER — FAMOTIDINE 20 MG PO TABS: 20.0000 mg | ORAL_TABLET | Freq: Every day | ORAL | 0 refills | Status: AC

## 2022-02-27 MED ORDER — TRIAMCINOLONE ACETONIDE 0.1 % EX CREA: 1.0000 | TOPICAL_CREAM | Freq: Two times a day (BID) | CUTANEOUS | 0 refills | Status: AC

## 2022-02-27 MED ORDER — METHYLPREDNISOLONE ACETATE 80 MG/ML IJ SUSP
60.0000 mg | Freq: Once | INTRAMUSCULAR | Status: AC
  Administered 2022-02-27: 60 mg via INTRAMUSCULAR

## 2022-02-27 MED ORDER — METHYLPREDNISOLONE ACETATE 80 MG/ML IJ SUSP
INTRAMUSCULAR | Status: AC
  Filled 2022-02-27: qty 1

## 2022-02-27 MED ORDER — CETIRIZINE HCL 10 MG PO TABS: 10.0000 mg | ORAL_TABLET | Freq: Every day | ORAL | 0 refills | Status: AC

## 2022-02-27 NOTE — ED Triage Notes (Signed)
Onset 2-3 days rash all over her body. Pt reports she cannot stop itching.

## 2022-02-27 NOTE — Discharge Instructions (Addendum)
I believe that you been bitten by something.  We gave the injection of steroids today.  We are going to start allergy medication.  Please take Zyrtec in the morning and famotidine at night.  This will help with the itching.  Use hypoallergenic soaps and detergents.  Apply ointment to specific areas.  Follow-up with your primary care within the week.  I do recommend that you use hypoallergenic soaps and detergents and make sure that you are wash everything in hot water and dry them in high heat.  If anything changes please be seen immediately.

## 2022-02-27 NOTE — ED Provider Notes (Addendum)
[place]    CSN: [id number] Arrival date & time: 02/27/22  6767      History   Chief Complaint Chief Complaint  Patient presents with   Rash    HPI Emily Benton is a 76 y.o. female.   Patient presents today with a 6-day history of pruritic rash involving her whole body.  She reports symptoms are generalized but primarily located on her trunk.  She denies any new exposures including to plants, insects, animals.  She denies any household sick contacts with similar symptoms.  Denies any recent illness or additional symptoms including fever, nausea, vomiting, headache, chest pain, shortness of breath.  She has tried topical medications without improvement of symptoms.  She denies history of dermatological condition.  She reports symptoms are interfering with her ability to sleep at night due to intensity of pruritus.  She denies any recent medication changes.    Past Medical History:  Diagnosis Date   Hypertension     Patient Active Problem List   Diagnosis Date Noted   Sepsis ([place]) 11/02/2018   New onset type 2 diabetes mellitus ([place]) 11/02/2018   Pyelonephritis of right kidney 11/01/2018   SIRS (systemic inflammatory response syndrome) ([place]) 11/01/2018   ARF (acute renal failure) ([place]) 11/01/2018    Past Surgical History:  Procedure Laterality Date   ABDOMINAL HYSTERECTOMY     BREAST CYST ASPIRATION      OB History   No obstetric history on file.      Home Medications    Prior to Admission medications   Medication Sig Start Date End Date Taking? Authorizing Provider  cetirizine (ZYRTEC ALLERGY) 10 MG tablet Take 1 tablet (10 mg total) by mouth daily. 02/27/22  Yes Raspet, Erin K, PA-C  famotidine (PEPCID) 20 MG tablet Take 1 tablet (20 mg total) by mouth at bedtime. 02/27/22  Yes Raspet, Erin K, PA-C  triamcinolone cream (KENALOG) 0.1 % Apply 1 Application topically 2 (two) times daily. 02/27/22  Yes Raspet, Derry Skill, PA-C  acetaminophen (TYLENOL)  500 MG tablet Take 500-1,000 mg by mouth every 6 (six) hours as needed for mild pain or headache.     [provider]  amLODipine (NORVASC) 5 MG tablet Take 1 tablet (5 mg total) by mouth daily. 11/07/18   Regalado, Belkys A, MD  Aspirin-Acetaminophen-Caffeine (GOODY HEADACHE PO) Take 1 packet by mouth as needed (for headaches or pain).    [provider]  blood glucose meter kit and supplies Dispense based on patient and insurance preference. Use up to four times daily as directed. (FOR ICD-10 E10.9, E11.9). 11/07/18   Regalado, Belkys A, MD  buPROPion (WELLBUTRIN XL) 300 MG 24 hr tablet Take 300 mg by mouth daily.    [provider]  etodolac (LODINE) 500 MG tablet Take 1 tablet (500 mg total) by mouth 2 (two) times daily as needed (PAIN). 06/05/20   Enrique Sack, FNP  LORazepam (ATIVAN) 0.5 MG tablet Take 0.5 mg by mouth daily as needed for anxiety.    [provider]  saccharomyces boulardii (FLORASTOR) 250 MG capsule Take 1 capsule (250 mg total) by mouth 2 (two) times daily. 11/07/18   Regalado, Cassie Freer, MD    Family History Family History  Problem Relation Age of Onset   Diabetes Mellitus II Mother    Breast cancer Mother        54s   Diabetes Mellitus II Father    CAD Neg Hx     Social History Social  History   Tobacco Use   Smoking status: Former   Smokeless tobacco: Never  Vaping Use   Vaping Use: Never used  Substance Use Topics   Alcohol use: No   Drug use: No     Allergies   Codeine   Review of Systems Review of Systems  Constitutional:  Positive for activity change. Negative for appetite change, fatigue and fever.  Respiratory:  Negative for cough and shortness of breath.   Cardiovascular:  Negative for chest pain.  Gastrointestinal:  Negative for abdominal pain, diarrhea, nausea and vomiting.  Skin:  Positive for rash.  Neurological:  Negative for dizziness, light-headedness and headaches.     Physical Exam Triage  Vital Signs ED Triage Vitals  Enc Vitals Group     BP 02/27/22 1046 (!) 147/82     Pulse Rate 02/27/22 1049 98     Resp 02/27/22 1046 16     Temp --      Temp src --      SpO2 02/27/22 1046 98 %     Weight --      Height --      Head Circumference --      Peak Flow --      Pain Score --      Pain Loc --      Pain Edu? --      Excl. in [place]? --    No data found.  Updated Vital Signs BP (!) 147/82 (BP Location: Left Arm)   Pulse 98   Resp 16   SpO2 98%   Visual Acuity Right Eye Distance:   Left Eye Distance:   Bilateral Distance:    Right Eye Near:   Left Eye Near:    Bilateral Near:     Physical Exam Vitals reviewed.  Constitutional:      General: She is awake. She is not in acute distress.    Appearance: Normal appearance. She is well-developed. She is not ill-appearing.     Comments: Very pleasant female appears stated age in no acute distress sitting comfortably on exam room table  HENT:     Head: Normocephalic and atraumatic.     Mouth/Throat:     Pharynx: Uvula midline. No oropharyngeal exudate or posterior oropharyngeal erythema.  Cardiovascular:     Rate and Rhythm: Normal rate and regular rhythm.     Heart sounds: Normal heart sounds, S1 normal and S2 normal. No murmur heard. Pulmonary:     Effort: Pulmonary effort is normal.     Breath sounds: Normal breath sounds. No wheezing, rhonchi or rales.     Comments: Clear to auscultation bilaterally Skin:    Findings: Rash present. Rash is papular.     Comments: Widespread papular rash noted with evidence of excoriation.  Psychiatric:        Behavior: Behavior is cooperative.      UC Treatments / Results  Labs (all labs ordered are listed, but only abnormal results are displayed) Labs Reviewed - No data to display  EKG   Radiology No results found.  Procedures Procedures (including critical care time)  Medications Ordered in UC Medications - No data to display  Initial Impression /  Assessment and Plan / UC Course  I have reviewed the triage vital signs and the nursing notes.  Pertinent labs & imaging results that were available during my care of the patient were reviewed by me and considered in my medical decision making (see chart for details).  Rash appears to be insect bites though patient does not know how she would have been exposed to this.  Recommended alternating H1 and H2 blockade as well as triamcinolone cream for specific bothersome areas.  She is to wear loosefitting clothing and use hypoallergenic soaps and detergents.  Discussed that she should clean everything and wash all of her linens in hot water and dry on high heat.  Recommended that she wear longsleeve clothing and long pants whenever she is outside as well as use insect repellent.  She is to follow-up closely with her primary care.  If she has any worsening symptoms including spread of rash, nausea, vomiting, fever she needs to be seen immediately.  At the end of the visit as patient was being discharged she did request an injection of something to help with the itching.  She does have a history of diabetes but reports her blood sugars have been adequately controlled.  This was checked in clinic today and was found to be appropriate at 113.  She was given Depo-Medrol 60 mg.  Discussed that this can cause hyperglycemia and she should avoid carbohydrates and make sure that she is pushing fluids.  If she develops any side effects or concerning symptoms she needs to be seen immediately to which she expressed understanding.  Final Clinical Impressions(s) / UC Diagnoses   Final diagnoses:  Pruritic rash     Discharge Instructions      I believe that you been bitten by something.  We are going to start allergy medication.  Please take Zyrtec in the morning and famotidine at night.  This will help with the itching.  Use hypoallergenic soaps and detergents.  Apply ointment to specific areas.  Follow-up with  your primary care within the week.  I do recommend that you use hypoallergenic soaps and detergents and make sure that you are wash everything in hot water and dry them in high heat.  If anything changes please be seen immediately.     ED Prescriptions     Medication Sig Dispense Auth. Provider   cetirizine (ZYRTEC ALLERGY) 10 MG tablet Take 1 tablet (10 mg total) by mouth daily. 30 tablet Breyon Sigg K, PA-C   famotidine (PEPCID) 20 MG tablet Take 1 tablet (20 mg total) by mouth at bedtime. 30 tablet Hilman Kissling K, PA-C   triamcinolone cream (KENALOG) 0.1 % Apply 1 Application topically 2 (two) times daily. 30 g Havilah Topor, Derry Skill, PA-C      PDMP not reviewed this encounter.   Terrilee Croak, PA-C 02/27/22 1119    Migel Hannis K, PA-C 02/27/22 1140

## 2022-03-01 ENCOUNTER — Other Ambulatory Visit: Payer: Self-pay | Admitting: Internal Medicine

## 2022-03-01 DIAGNOSIS — M8588 Other specified disorders of bone density and structure, other site: Secondary | ICD-10-CM

## 2022-04-29 ENCOUNTER — Other Ambulatory Visit: Payer: Self-pay | Admitting: Internal Medicine

## 2022-04-29 DIAGNOSIS — Z1231 Encounter for screening mammogram for malignant neoplasm of breast: Secondary | ICD-10-CM

## 2022-05-28 ENCOUNTER — Ambulatory Visit
Admission: RE | Admit: 2022-05-28 | Discharge: 2022-05-28 | Disposition: A | Discharge status: Home / Self Care | Payer: Medicare Other | Source: Ambulatory Visit | Attending: Internal Medicine | Admitting: Internal Medicine

## 2022-05-28 DIAGNOSIS — Z1231 Encounter for screening mammogram for malignant neoplasm of breast: Secondary | ICD-10-CM

## 2023-03-11 ENCOUNTER — Other Ambulatory Visit: Payer: Self-pay | Admitting: Internal Medicine

## 2023-03-11 DIAGNOSIS — M8580 Other specified disorders of bone density and structure, unspecified site: Secondary | ICD-10-CM

## 2023-04-14 ENCOUNTER — Other Ambulatory Visit: Payer: Self-pay | Admitting: Internal Medicine

## 2023-04-14 DIAGNOSIS — Z1231 Encounter for screening mammogram for malignant neoplasm of breast: Secondary | ICD-10-CM

## 2023-05-30 ENCOUNTER — Ambulatory Visit
Admission: RE | Admit: 2023-05-30 | Discharge: 2023-05-30 | Disposition: A | Discharge status: Home / Self Care | Payer: Medicare Other | Source: Ambulatory Visit | Attending: Internal Medicine | Admitting: Internal Medicine

## 2023-05-30 DIAGNOSIS — Z1231 Encounter for screening mammogram for malignant neoplasm of breast: Secondary | ICD-10-CM

## 2023-09-09 ENCOUNTER — Ambulatory Visit
Admission: RE | Admit: 2023-09-09 | Discharge: 2023-09-09 | Disposition: A | Discharge status: Home / Self Care | Payer: Medicare Other | Source: Ambulatory Visit | Attending: Internal Medicine | Admitting: Internal Medicine

## 2023-09-09 DIAGNOSIS — M8580 Other specified disorders of bone density and structure, unspecified site: Secondary | ICD-10-CM

## 2024-06-03 ENCOUNTER — Other Ambulatory Visit: Payer: Self-pay | Admitting: Internal Medicine

## 2024-06-03 DIAGNOSIS — Z1231 Encounter for screening mammogram for malignant neoplasm of breast: Secondary | ICD-10-CM

## 2024-06-23 NOTE — Progress Notes (Signed)
 Emily Benton                                          MRN: [id number]   06/23/2024   The [facility] Quality Team Specialist reviewed this patient medical record for the purposes of chart review for care gap closure. The following were reviewed: chart review for care gap closure-kidney health evaluation for diabetes:eGFR  and uACR.    [facility] Quality Team

## 2024-07-01 ENCOUNTER — Ambulatory Visit
Admission: RE | Admit: 2024-07-01 | Discharge: 2024-07-01 | Disposition: A | Source: Ambulatory Visit | Attending: Internal Medicine | Admitting: Internal Medicine

## 2024-07-01 DIAGNOSIS — Z1231 Encounter for screening mammogram for malignant neoplasm of breast: Secondary | ICD-10-CM
